# Patient Record
Sex: Female | Born: 2000 | Race: Black or African American | Hispanic: No | Marital: Single | Smoking: Never smoker
Health system: Southern US, Community
[De-identification: ages and names within clinical notes are randomized; demographics above are authoritative.]

## PROBLEM LIST (undated history)

## (undated) ENCOUNTER — Ambulatory Visit

## (undated) DIAGNOSIS — S060X9A Concussion with loss of consciousness of unspecified duration, initial encounter: Secondary | ICD-10-CM

## (undated) DIAGNOSIS — R519 Headache, unspecified: Secondary | ICD-10-CM

## (undated) DIAGNOSIS — R413 Other amnesia: Secondary | ICD-10-CM

## (undated) DIAGNOSIS — K759 Inflammatory liver disease, unspecified: Secondary | ICD-10-CM

## (undated) DIAGNOSIS — S060XAA Concussion with loss of consciousness status unknown, initial encounter: Secondary | ICD-10-CM

## (undated) DIAGNOSIS — L309 Dermatitis, unspecified: Secondary | ICD-10-CM

## (undated) DIAGNOSIS — R51 Headache: Secondary | ICD-10-CM

## (undated) DIAGNOSIS — R160 Hepatomegaly, not elsewhere classified: Secondary | ICD-10-CM

## (undated) HISTORY — DX: Hepatomegaly, not elsewhere classified: R16.0

## (undated) HISTORY — DX: Headache: R51

## (undated) HISTORY — DX: Dermatitis, unspecified: L30.9

## (undated) HISTORY — PX: NO PAST SURGERIES: SHX2092

## (undated) HISTORY — DX: Inflammatory liver disease, unspecified: K75.9

## (undated) HISTORY — DX: Other amnesia: R41.3

## (undated) HISTORY — DX: Headache, unspecified: R51.9

---
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
## 2018-09-02 ENCOUNTER — Other Ambulatory Visit: Payer: Self-pay

## 2018-09-02 DIAGNOSIS — Z20822 Contact with and (suspected) exposure to covid-19: Secondary | ICD-10-CM

## 2018-09-05 LAB — NOVEL CORONAVIRUS, NAA: SARS-CoV-2, NAA: NOT DETECTED

## 2018-09-28 ENCOUNTER — Ambulatory Visit (INDEPENDENT_AMBULATORY_CARE_PROVIDER_SITE_OTHER): Payer: BC Managed Care – PPO | Admitting: Women's Health

## 2018-09-28 ENCOUNTER — Other Ambulatory Visit: Payer: Self-pay

## 2018-09-28 ENCOUNTER — Encounter: Payer: Self-pay | Admitting: Women's Health

## 2018-09-28 VITALS — BP 121/76 | HR 107 | Ht 69.0 in | Wt 202.5 lb

## 2018-09-28 DIAGNOSIS — Z3046 Encounter for surveillance of implantable subdermal contraceptive: Secondary | ICD-10-CM

## 2018-09-28 DIAGNOSIS — Z30011 Encounter for initial prescription of contraceptive pills: Secondary | ICD-10-CM

## 2018-09-28 MED ORDER — LO LOESTRIN FE 1 MG-10 MCG / 10 MCG PO TABS: 1.0000 | ORAL_TABLET | Freq: Every day | ORAL | 3 refills | Status: DC

## 2018-09-28 NOTE — Progress Notes (Signed)
   [place] REMOVAL Patient name: Alisha Norman MRN [id number]  Date of birth: 09-27-2000 Subjective Findings:   Alisha Norman is a 18 y.o. G0P0000 African American female being seen today for removal of a Nexplanon. Her Nexplanon was placed 10/25/14.  She desires removal because it's past time, weight gain, and headaches. Signed copy of informed consent in chart.   No LMP recorded. Patient has had an implant. Last pap<21yo. Results were:  n/a The planned method of family planning is OCP (estrogen/progesterone). Does not smoke, no h/o HTN, DVT/PE, CVA, MI, or migraines w/ aura.  Pertinent History Reviewed:   Reviewed past medical,surgical, social, obstetrical and family history.  Reviewed problem list, medications and allergies. Objective Findings & Procedure:    Vitals:   09/28/18 1549  BP: 121/76  Pulse: (!) 107  Weight: 202 lb 8 oz (91.9 kg)  Height: 5\' 9"  (1.753 m)  Body mass index is 29.9 kg/m.  No results found for this or any previous visit (from the past 24 hour(s)).   Time out was performed.  Nexplanon site identified.  Area prepped in usual sterile fashon. One cc of 2% lidocaine was used to anesthetize the area at the distal end of the implant. A small stab incision was made right beside the implant on the distal portion.  The Nexplanon rod was grasped using hemostats and removed without difficulty.  There was less than 3 cc blood loss. There were no complications.  Steri-strips were applied over the small incision and a pressure bandage was applied.  The patient tolerated the procedure well. Assessment & Plan:   1) Nexplanon removal She was instructed to keep the area clean and dry, remove pressure bandage in 24 hours, and keep insertion site covered with the steri-strip for 3-5 days.   Follow-up PRN problems.  2) Contraception management> rx LoLoestrin, condoms x 2wks, f/u 62mths  No orders of the defined types were placed in this encounter.   Follow-up: Return in  about 3 months (around 12/29/2018) for F/U, MyChart Video then after 1/3 for , Pap & physical.  Roma Schanz CNM, [facility] 09/28/2018 4:17 PM

## 2018-09-28 NOTE — Patient Instructions (Addendum)
Keep the area clean and dry.  You can remove the big bandage in 24 hours, and the small steri-strip bandage in 3-5 days.  A back up method, such as condoms, should be used for two weeks.   Text Lo Loestrin Fe to [phone]  Oral Contraception Information Oral contraceptive pills (OCPs) are medicines taken to prevent pregnancy. OCPs are taken by mouth, and they work by:  Preventing the ovaries from releasing eggs.  Thickening mucus in the lower part of the uterus (cervix), which prevents sperm from entering the uterus.  Thinning the lining of the uterus (endometrium), which prevents a fertilized egg from attaching to the endometrium. OCPs are highly effective when taken exactly as prescribed. However, OCPs do not prevent STIs (sexually transmitted infections). Safe sex practices, such as using condoms while on an OCP, can help prevent STIs. Before starting OCPs Before you start taking OCPs, you may have a physical exam, blood test, and Pap test. However, you are not required to have a pelvic exam in order to be prescribed OCPs. Your health care provider will make sure you are a good candidate for oral contraception. OCPs are not a good option for certain women, including women who smoke and are older than 35 years, and women with a medical history of high blood pressure, deep vein thrombosis, pulmonary embolism, stroke, cardiovascular disease, or peripheral vascular disease. Discuss with your health care provider the possible side effects of the OCP you may be prescribed. When you start an OCP, be aware that it can take 2-3 months for your body to adjust to changes in hormone levels. Follow instructions from your health care provider about how to start taking your first cycle of OCPs. Depending on when you start the pill, you may need to use a backup form of birth control, such as condoms, during the first week. Make sure you know what steps to take if you ever forget to take the pill. Types of oral  contraception  The most common types of birth control pills contain the hormones estrogen and progestin (synthetic progesterone) or progestin only. The combination pill This type of pill contains estrogen and progestin hormones. Combination pills often come in packs of 21, 28, or 91 pills. For each pack, the last 7 pills may not contain hormones, which means you may stop taking the pills for 7 days. Menstrual bleeding occurs during the week that you do not take the pills or that you take the pills with no hormones in them. The minipill This type of pill contains the progestin hormone only. It comes in packs of 28 pills. All 28 pills contain the hormone. You take the pill every day. It is very important to take the pill at the same time each day. Advantages of oral contraceptive pills  Provides reliable and continuous contraception if taken as instructed.  May treat or decrease symptoms of: ? Menstrual period cramps. ? Irregular menstrual cycle or bleeding. ? Heavy menstrual flow. ? Abnormal uterine bleeding. ? Acne, depending on the type of pill. ? Polycystic ovarian syndrome. ? Endometriosis. ? Iron deficiency anemia. ? Premenstrual symptoms, including premenstrual dysphoric disorder.  May reduce the risk of endometrial and ovarian cancer.  Can be used as emergency contraception.  Prevents mislocated (ectopic) pregnancies and infections of the fallopian tubes. Things that can make oral contraceptive pills less effective OCPs can be less effective if:  You forget to take the pill at the same time every day. This is especially important when taking  the minipill.  You have a stomach or intestinal disease that reduces your body's ability to absorb the pill.  You take OCPs with other medicines that make OCPs less effective, such as antibiotics, certain HIV medicines, and some seizure medicines.  You take expired OCPs.  You forget to restart the pill on day 7, if using the packs of  21 pills. Risks associated with oral contraceptive pills Oral contraceptive pills can sometimes cause side effects, such as:  Headache.  Depression.  Trouble sleeping.  Nausea and vomiting.  Breast tenderness.  Irregular bleeding or spotting during the first several months.  Bloating or fluid retention.  Increase in blood pressure. Combination pills are also associated with a small increase in the risk of:  Blood clots.  Heart attack.  Stroke. Summary  Oral contraceptive pills are medicines taken by mouth to prevent pregnancy. They are highly effective when taken exactly as prescribed.  The most common types of birth control pills contain the hormones estrogen and progestin (synthetic progesterone) or progestin only.  Before you start taking the pill, you may have a physical exam, blood test, and Pap test. Your health care provider will make sure you are a good candidate for oral contraception.  The combination pill may come in a 21-day pack, a 28-day pack, or a 91-day pack. The minipill contains the progesterone hormone only and comes in packs of 28 pills.  Oral contraceptive pills can sometimes cause side effects, such as headache, nausea, breast tenderness, or irregular bleeding. This information is not intended to replace advice given to you by your health care provider. Make sure you discuss any questions you have with your health care provider. Document Released: 04/19/2002 Document Revised: 01/09/2017 Document Reviewed: 04/22/2016 Elsevier Patient Education  2020 [employer].

## 2018-10-07 ENCOUNTER — Other Ambulatory Visit: Payer: Self-pay

## 2018-10-07 ENCOUNTER — Encounter (HOSPITAL_COMMUNITY): Payer: Self-pay | Admitting: Emergency Medicine

## 2018-10-07 ENCOUNTER — Emergency Department (HOSPITAL_COMMUNITY)
Admission: EM | Admit: 2018-10-07 | Discharge: 2018-10-07 | Disposition: A | Discharge status: Home / Self Care | Payer: BC Managed Care – PPO | Attending: Emergency Medicine | Admitting: Emergency Medicine

## 2018-10-07 DIAGNOSIS — S56911A Strain of unspecified muscles, fascia and tendons at forearm level, right arm, initial encounter: Secondary | ICD-10-CM | POA: Diagnosis not present

## 2018-10-07 DIAGNOSIS — T148XXA Other injury of unspecified body region, initial encounter: Secondary | ICD-10-CM

## 2018-10-07 DIAGNOSIS — X500XXA Overexertion from strenuous movement or load, initial encounter: Secondary | ICD-10-CM | POA: Diagnosis not present

## 2018-10-07 DIAGNOSIS — Y9389 Activity, other specified: Secondary | ICD-10-CM | POA: Diagnosis not present

## 2018-10-07 DIAGNOSIS — Z79899 Other long term (current) drug therapy: Secondary | ICD-10-CM | POA: Diagnosis not present

## 2018-10-07 DIAGNOSIS — S56912A Strain of unspecified muscles, fascia and tendons at forearm level, left arm, initial encounter: Secondary | ICD-10-CM | POA: Insufficient documentation

## 2018-10-07 DIAGNOSIS — Y999 Unspecified external cause status: Secondary | ICD-10-CM | POA: Insufficient documentation

## 2018-10-07 DIAGNOSIS — S59911A Unspecified injury of right forearm, initial encounter: Secondary | ICD-10-CM | POA: Diagnosis present

## 2018-10-07 DIAGNOSIS — Y9289 Other specified places as the place of occurrence of the external cause: Secondary | ICD-10-CM | POA: Diagnosis not present

## 2018-10-07 MED ORDER — NAPROXEN 500 MG PO TABS: 500.0000 mg | ORAL_TABLET | Freq: Two times a day (BID) | ORAL | 0 refills | Status: DC

## 2018-10-07 NOTE — ED Notes (Signed)
Signature pad erased signature  Pt stated she understood discharge information

## 2018-10-07 NOTE — Discharge Instructions (Signed)
Your exam is consistent with having a muscle strain, this is an overuse injury.  Please do light duty for the next week, take naproxen twice daily, plenty of fluids.

## 2018-10-07 NOTE — ED Provider Notes (Signed)
[facility] EMERGENCY DEPARTMENT Provider Note   CSN: [id number] Arrival date & time: 10/07/18  2118     History   Chief Complaint Chief Complaint  Patient presents with  . Arm Pain    bilateral    HPI Alisha Norman is a 18 y.o. female.     HPI  The patient has bilateral forearm pain that occurred approximately 1 week after starting a job where she is doing heavy lifting as well as lifting weights.  This is located in the proximal forearms on the dorsal surface just distal to the elbow.  Worse with range of motion and lifting movements.  Not associated with any numbness or weakness.  Past Medical History:  Diagnosis Date  . Amnesia   . Concussion   . Dermatitis   . Enlarged liver   . Headache   . Hepatitis     There are no active problems to display for this patient.   History reviewed. No pertinent surgical history.   OB History    Gravida  0   Para  0   Term  0   Preterm  0   AB  0   Living  0     SAB  0   TAB  0   Ectopic  0   Multiple  0   Live Births  0            Home Medications    Prior to Admission medications   Medication Sig Start Date End Date Taking? Authorizing Provider  etonogestrel (NEXPLANON) 68 MG IMPL implant 1 each by Subdermal route once.    [provider]  Multiple Vitamin (MULTIVITAMIN WITH MINERALS) TABS tablet Take 1 tablet by mouth daily.    [provider]  naproxen (NAPROSYN) 500 MG tablet Take 1 tablet (500 mg total) by mouth 2 (two) times daily with a meal. 10/07/18   Eber HongMiller, Keefe Zawistowski, MD  Norethindrone-Ethinyl Estradiol-Fe Biphas (LO LOESTRIN FE) 1 MG-10 MCG / 10 MCG tablet Take 1 tablet by mouth daily. 09/28/18   Cheral MarkerBooker, Kimberly R, CNM  traMADol Janean Sark(ULTRAM) 50 MG tablet 1 or 2 po bid with food Patient not taking: Reported on 11/27/2017 06/29/17   Ivery QualeBryant, Hobson, PA-C    Family History Family History  Problem Relation Age of Onset  . Heart attack Paternal Grandfather   . Diabetes Maternal  Grandmother     Social History Social History   Tobacco Use  . Smoking status: Never Smoker  . Smokeless tobacco: Never Used  Substance Use Topics  . Alcohol use: No  . Drug use: No     Allergies   Codeine and Penicillins   Review of Systems Review of Systems  Musculoskeletal: Positive for myalgias. Negative for joint swelling.  Skin: Negative for color change, pallor, rash and wound.  Neurological: Negative for weakness and numbness.     Physical Exam Updated Vital Signs BP 118/80 (BP Location: Right Arm)   Pulse 91   Temp 98.2 F (36.8 C) (Oral)   Resp 16   Ht 1.753 m (5\' 9" )   Wt 91.6 kg   SpO2 99%   BMI 29.83 kg/m   Physical Exam Vitals signs and nursing note reviewed.  Constitutional:      Appearance: She is well-developed. She is not diaphoretic.  HENT:     Head: Normocephalic and atraumatic.  Eyes:     General:        Right eye: No discharge.  Left eye: No discharge.     Conjunctiva/sclera: Conjunctivae normal.  Pulmonary:     Effort: Pulmonary effort is normal. No respiratory distress.  Musculoskeletal:        General: Tenderness present.     Comments: Mild tenderness in the bilateral forearms just distal to the elbow over the dorsal radial surface.  Worse with extension of the wrists  Skin:    General: Skin is warm and dry.     Findings: No erythema or rash.  Neurological:     Mental Status: She is alert.     Coordination: Coordination normal.      ED Treatments / Results  Labs (all labs ordered are listed, but only abnormal results are displayed) Labs Reviewed - No data to display  EKG None  Radiology No results found.  Procedures Procedures (including critical care time)  Medications Ordered in ED Medications - No data to display   Initial Impression / Assessment and Plan / ED Course  I have reviewed the triage vital signs and the nursing notes.  Pertinent labs & imaging results that were available during my care  of the patient were reviewed by me and considered in my medical decision making (see chart for details).       There is and discomfort is bilateral, symmetrical, associated with movement of those muscles, not associated with any other pathologic findings.  Patient stable for discharge on an anti-inflammatory, light duty for 1 week  Final Clinical Impressions(s) / ED Diagnoses   Final diagnoses:  Muscle strain    ED Discharge Orders         Ordered    naproxen (NAPROSYN) 500 MG tablet  2 times daily with meals     10/07/18 2234           Noemi Chapel, MD 10/07/18 2236

## 2018-10-07 NOTE — ED Triage Notes (Signed)
Pt reports bilateral posterior forearm pain after working out and lifting boxes. ROM within normal limits

## 2018-10-18 IMAGING — DX DG CHEST 2V
2 series · 2 of 2 positions shown · non-contrast
Comparison: 02/28/2015

CLINICAL DATA: Chest pain starting last night

EXAM:
CHEST  2 VIEW

[chest pa]
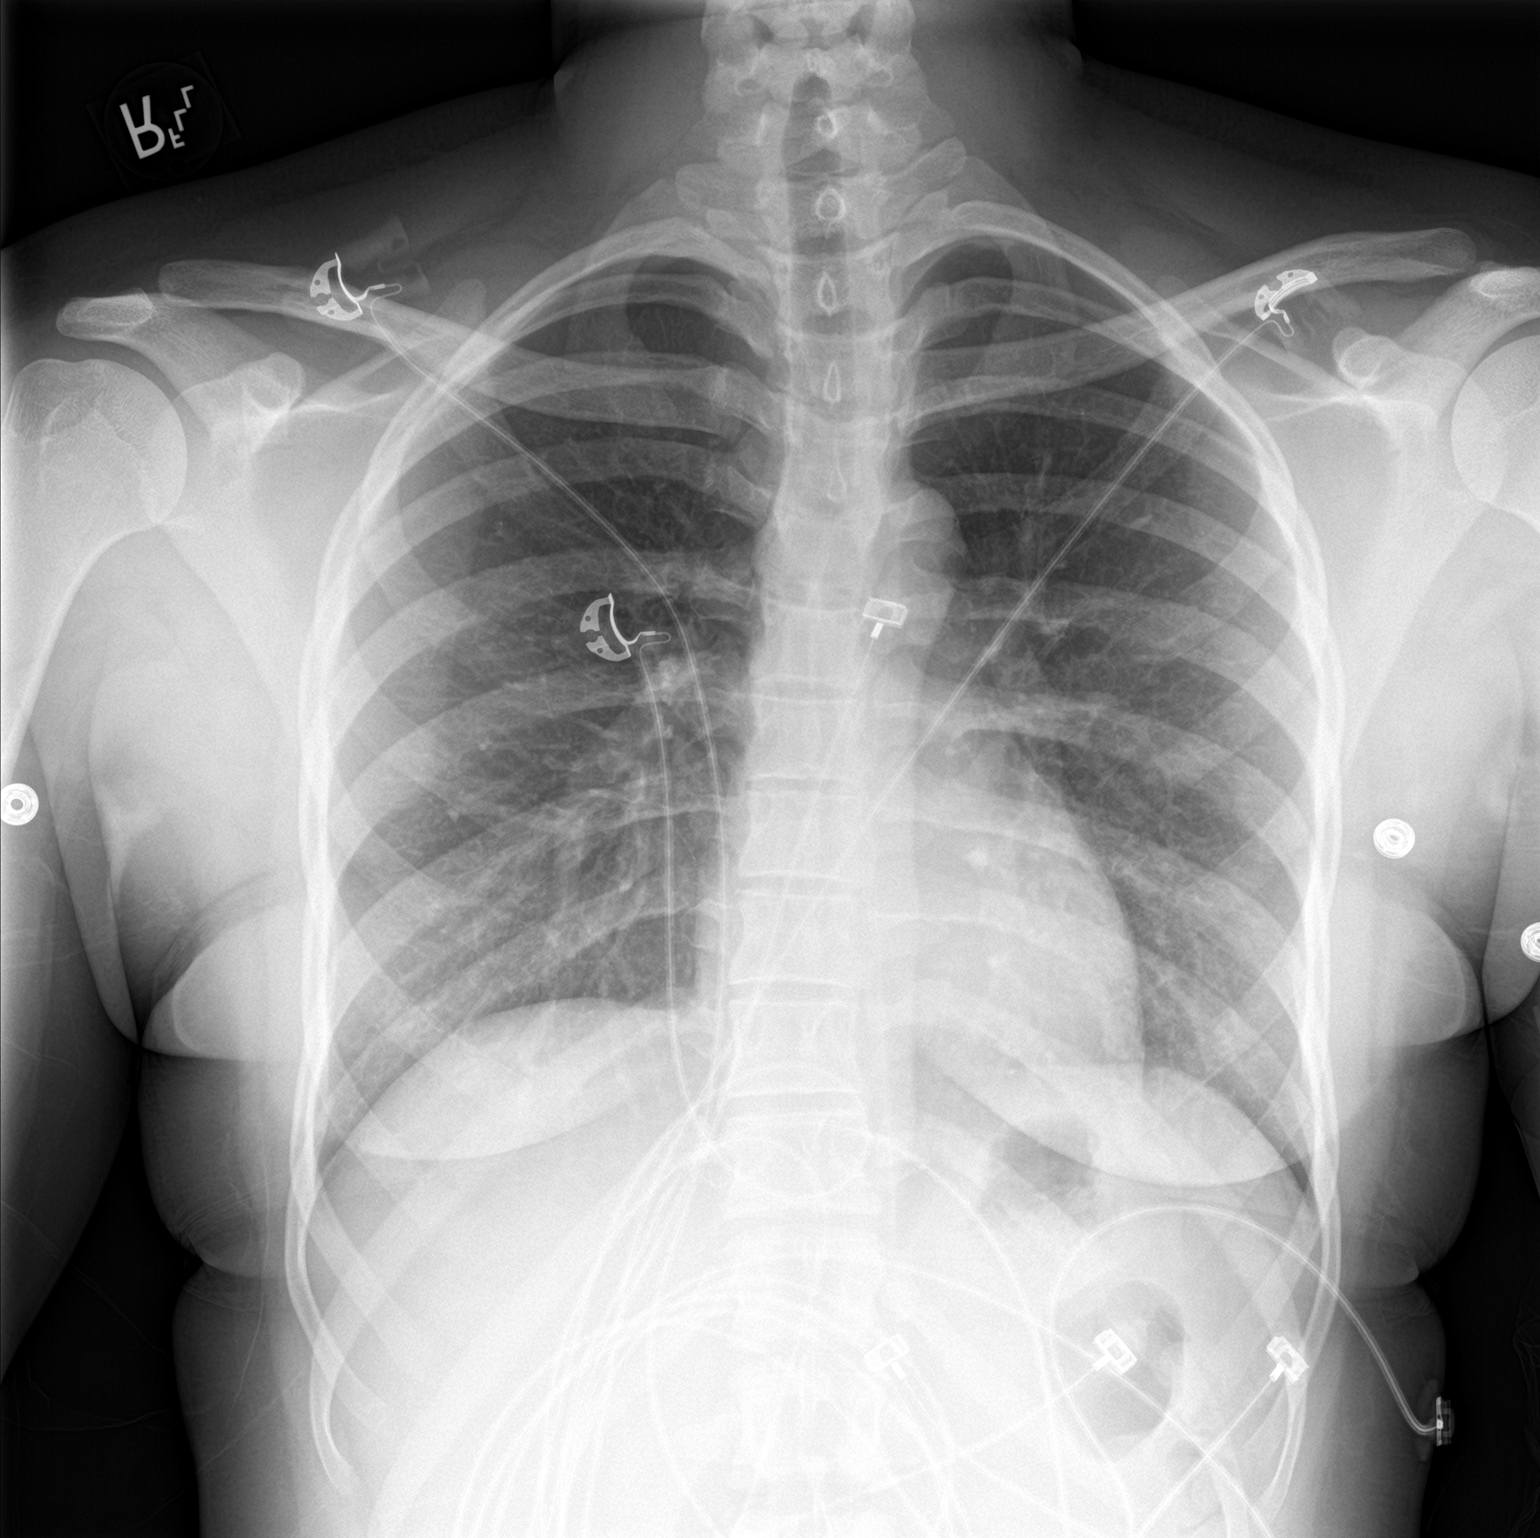

[chest lat]
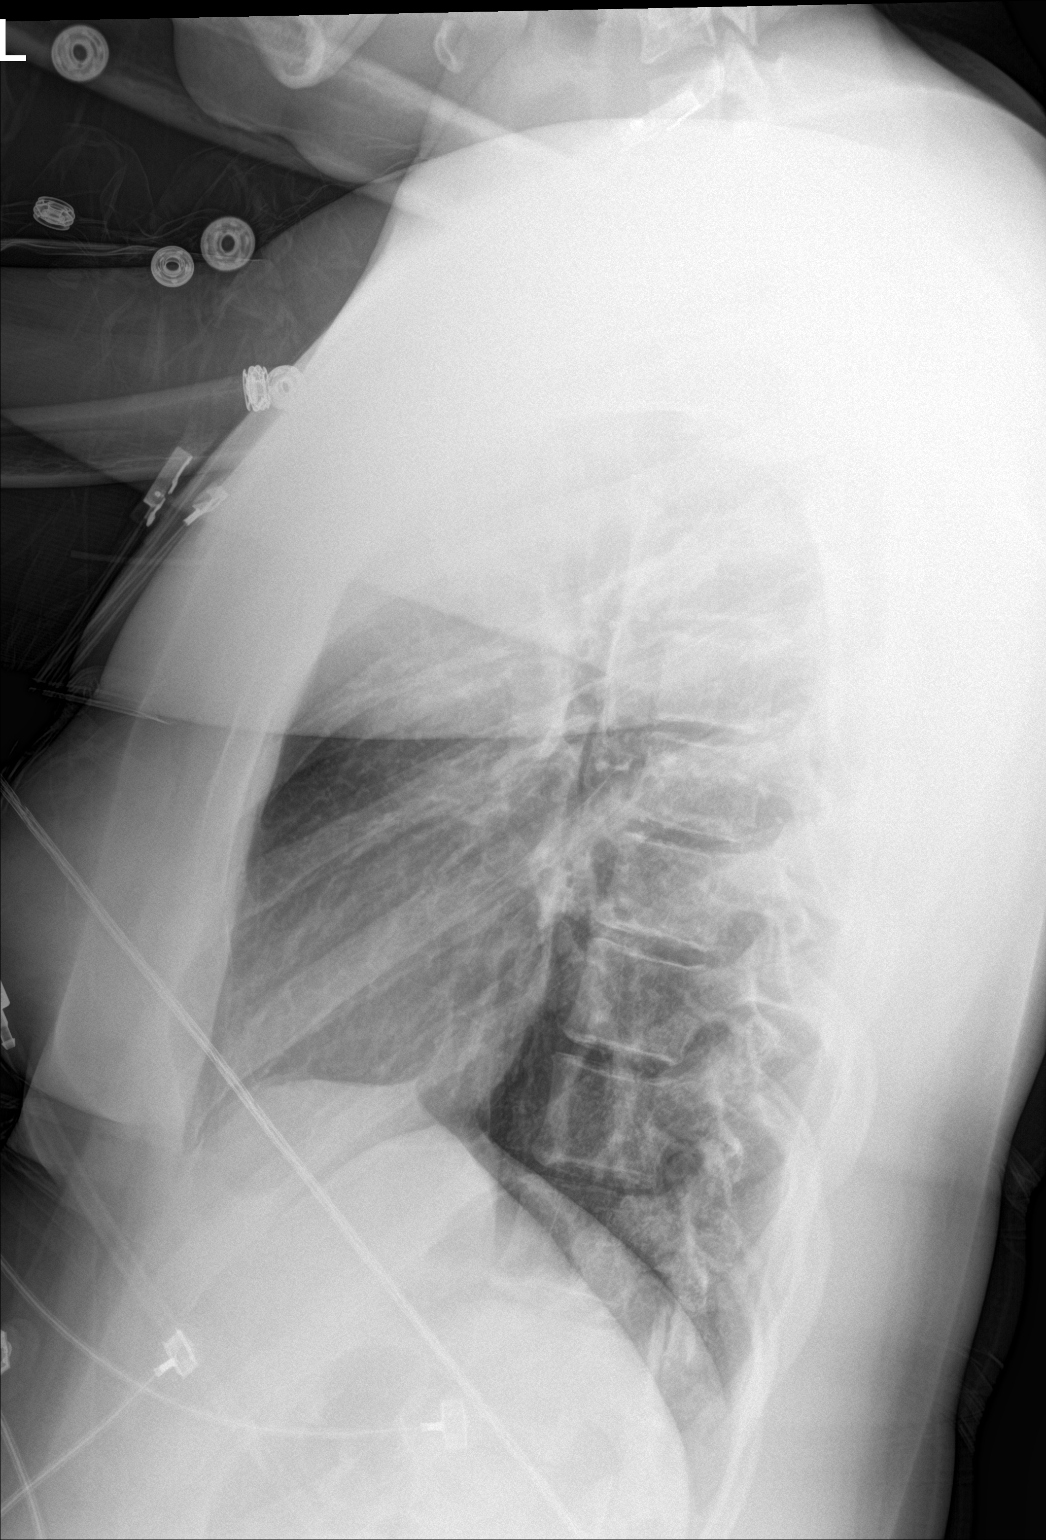

[2 of 2 positions shown; findings below may reference images not displayed]

FINDINGS: Cardiomediastinal silhouette is stable. No infiltrate or pleural
effusion. No pulmonary edema. Bony thorax is stable.
IMPRESSION: No active cardiopulmonary disease.

## 2018-10-26 ENCOUNTER — Encounter: Payer: Self-pay | Admitting: *Deleted

## 2018-12-08 ENCOUNTER — Other Ambulatory Visit: Payer: Self-pay | Admitting: *Deleted

## 2018-12-08 DIAGNOSIS — Z20822 Contact with and (suspected) exposure to covid-19: Secondary | ICD-10-CM

## 2018-12-10 LAB — NOVEL CORONAVIRUS, NAA: SARS-CoV-2, NAA: NOT DETECTED

## 2018-12-28 ENCOUNTER — Encounter: Payer: Self-pay | Admitting: Women's Health

## 2018-12-28 ENCOUNTER — Other Ambulatory Visit: Payer: Self-pay

## 2018-12-28 ENCOUNTER — Telehealth (INDEPENDENT_AMBULATORY_CARE_PROVIDER_SITE_OTHER): Payer: BLUE CROSS/BLUE SHIELD | Admitting: Women's Health

## 2018-12-28 DIAGNOSIS — Z3041 Encounter for surveillance of contraceptive pills: Secondary | ICD-10-CM | POA: Diagnosis not present

## 2018-12-28 NOTE — Progress Notes (Signed)
   TELEHEALTH VIRTUAL GYN VISIT ENCOUNTER NOTE Patient name: Alisha Norman MRN [id number]  Date of birth: [birth year]  I connected with patient on 12/28/18 at 11:50 AM EST by telephone (unable to get her mychart to work) and verified that I am speaking with the correct person using two identifiers.  Due to COVID-19 recommendations, pt is not currently in the office.    I discussed the limitations, risks, security and privacy concerns of performing an evaluation and management service by telephone and the availability of in person appointments. I also discussed with the patient that there may be a patient responsible charge related to this service. The patient expressed understanding and agreed to proceed.   Chief Complaint:   Follow-up (Birth Control Pills)  History of Present Illness:   Caleyah Jr Casad is a 18 y.o. G0P0000 African American female being evaluated today for f/u on LoLoestrin rx'd 09/28/18. Doing well, had period 1st pack, but not 2nd.      Patient's last menstrual period was 11/25/2018 (exact date). The current method of family planning is OCP (estrogen/progesterone).  Last pap <21yo. Results were:  n/a Review of Systems:   Pertinent items are noted in HPI Denies fever/chills, dizziness, headaches, visual disturbances, fatigue, shortness of breath, chest pain, abdominal pain, vomiting, abnormal vaginal discharge/itching/odor/irritation, problems with periods, bowel movements, urination, or intercourse unless otherwise stated above.  Pertinent History Reviewed:  Reviewed past medical,surgical, social, obstetrical and family history.  Reviewed problem list, medications and allergies. Physical Assessment:  There were no vitals filed for this visit.There is no height or weight on file to calculate BMI.       Physical Examination:   General:  Alert, oriented and cooperative.   Mental Status: Normal mood and affect perceived. Normal judgment and thought content.  Physical exam  deferred due to nature of the encounter  No results found for this or any previous visit (from the past 24 hour(s)).  Assessment & Plan:  1) Contraception surveillance> doing well on LoLoestrin, continue as rx'd  Meds: No orders of the defined types were placed in this encounter.   No orders of the defined types were placed in this encounter.   I discussed the assessment and treatment plan with the patient. The patient was provided an opportunity to ask questions and all were answered. The patient agreed with the plan and demonstrated an understanding of the instructions.   The patient was advised to call back or seek an in-person evaluation/go to the ED if the symptoms worsen or if the condition fails to improve as anticipated.  I provided 10 minutes of non-face-to-face time during this encounter.   Return for after 1/3 for , Pap & physical.  Roma Schanz CNM, [facility] 12/28/2018 12:29 PM

## 2019-02-16 ENCOUNTER — Telehealth: Payer: Self-pay | Admitting: *Deleted

## 2019-02-16 ENCOUNTER — Other Ambulatory Visit: Payer: Self-pay | Admitting: Adult Health

## 2019-02-16 MED ORDER — NORETHIN ACE-ETH ESTRAD-FE 1-20 MG-MCG PO TABS: 1.0000 | ORAL_TABLET | Freq: Every day | ORAL | 11 refills | Status: DC

## 2019-02-16 NOTE — Telephone Encounter (Signed)
Pt's new insurance is not covering her current birth control. She needs something insurance will cover. Thanks!!

## 2019-02-16 NOTE — Telephone Encounter (Addendum)
Pt has [employer]. Pt will send a message through MyChart letting us know what is covered. JSY

## 2019-02-16 NOTE — Progress Notes (Signed)
will rx junel 1/20,since lo Loestrin not covered by insurance

## 2019-05-02 ENCOUNTER — Ambulatory Visit: Payer: BLUE CROSS/BLUE SHIELD | Attending: Internal Medicine

## 2019-05-03 ENCOUNTER — Other Ambulatory Visit: Payer: Self-pay

## 2019-05-03 ENCOUNTER — Ambulatory Visit: Payer: Managed Care, Other (non HMO) | Attending: Internal Medicine

## 2019-05-03 DIAGNOSIS — Z20822 Contact with and (suspected) exposure to covid-19: Secondary | ICD-10-CM

## 2019-05-05 LAB — NOVEL CORONAVIRUS, NAA: SARS-CoV-2, NAA: NOT DETECTED

## 2019-05-05 LAB — SARS-COV-2, NAA 2 DAY TAT

## 2019-09-20 ENCOUNTER — Other Ambulatory Visit (INDEPENDENT_AMBULATORY_CARE_PROVIDER_SITE_OTHER): Payer: Managed Care, Other (non HMO) | Admitting: *Deleted

## 2019-09-20 ENCOUNTER — Other Ambulatory Visit (HOSPITAL_COMMUNITY)
Admission: RE | Admit: 2019-09-20 | Discharge: 2019-09-20 | Disposition: A | Discharge status: Home / Self Care | Payer: Managed Care, Other (non HMO) | Source: Ambulatory Visit | Attending: Obstetrics & Gynecology | Admitting: Obstetrics & Gynecology

## 2019-09-20 DIAGNOSIS — Z113 Encounter for screening for infections with a predominantly sexual mode of transmission: Secondary | ICD-10-CM | POA: Insufficient documentation

## 2019-09-20 NOTE — Progress Notes (Signed)
   NURSE VISIT-STD  SUBJECTIVE:  Alisha Norman is a 19 y.o. G0P0000 GYN patientfemale here for a vaginal swab for STD screen.  She reports the following symptoms: none for 0 days. Denies abnormal vaginal bleeding, significant pelvic pain, fever, or UTI symptoms.  OBJECTIVE:  There were no vitals taken for this visit.  Appears well, in no apparent distress  ASSESSMENT: Vaginal swab for STD screen  PLAN: Self-collected vaginal probe for Gonorrhea, Chlamydia, Trichomonas sent to lab.Order also given for HIV and RPR.  Treatment: to be determined once results are received Follow-up as needed if symptoms persist/worsen, or new symptoms develop  [name]  09/20/2019 11:06 AM

## 2019-09-20 NOTE — Progress Notes (Signed)
Chart reviewed for nurse visit. Agree with plan of care.  Adline Potter, NP 09/20/2019 12:32 PM

## 2019-09-21 LAB — HIV ANTIBODY (ROUTINE TESTING W REFLEX): HIV Screen 4th Generation wRfx: NONREACTIVE

## 2019-09-21 LAB — CERVICOVAGINAL ANCILLARY ONLY
Chlamydia: NEGATIVE
Comment: NEGATIVE
Comment: NEGATIVE
Comment: NORMAL
Neisseria Gonorrhea: NEGATIVE
Trichomonas: NEGATIVE

## 2019-09-21 LAB — RPR: RPR Ser Ql: NONREACTIVE

## 2019-10-25 ENCOUNTER — Other Ambulatory Visit: Payer: Self-pay

## 2019-10-25 ENCOUNTER — Other Ambulatory Visit (HOSPITAL_COMMUNITY)
Admission: RE | Admit: 2019-10-25 | Discharge: 2019-10-25 | Disposition: A | Discharge status: Home / Self Care | Payer: Managed Care, Other (non HMO) | Source: Ambulatory Visit | Attending: Obstetrics & Gynecology | Admitting: Obstetrics & Gynecology

## 2019-10-25 ENCOUNTER — Encounter: Payer: Self-pay | Admitting: Women's Health

## 2019-10-25 ENCOUNTER — Ambulatory Visit (INDEPENDENT_AMBULATORY_CARE_PROVIDER_SITE_OTHER): Payer: Managed Care, Other (non HMO) | Admitting: Women's Health

## 2019-10-25 VITALS — BP 126/85 | HR 96 | Ht 69.0 in | Wt 200.6 lb

## 2019-10-25 DIAGNOSIS — Z01419 Encounter for gynecological examination (general) (routine) without abnormal findings: Secondary | ICD-10-CM | POA: Insufficient documentation

## 2019-10-25 DIAGNOSIS — Z113 Encounter for screening for infections with a predominantly sexual mode of transmission: Secondary | ICD-10-CM | POA: Diagnosis not present

## 2019-10-25 NOTE — Progress Notes (Signed)
   WELL-WOMAN EXAMINATION Patient name: Alisha Norman MRN [id number]  Date of birth: 2000-11-20 Chief Complaint:   Gynecologic Exam (STD screening)  History of Present Illness:   Alisha Norman is a 19 y.o. G0P0000 African American female being seen today for a routine well-woman exam.  Current complaints: wants STD screen, condom came off inside of her  Depression screen [facility] 2/9 10/25/2019 03/23/2017  Decreased Interest 0 0  Down, Depressed, Hopeless 0 0  PHQ - 2 Score 0 0  Altered sleeping 0 -  Tired, decreased energy 0 -  Change in appetite 0 -  Feeling bad or failure about yourself  0 -  Trouble concentrating 0 -  Moving slowly or fidgety/restless 0 -  Suicidal thoughts 0 -  PHQ-9 Score 0 -     PCP: none      does not desire labs, other than STD screen Patient's last menstrual period was 09/26/2019. The current method of family planning is OCP (estrogen/progesterone).  Last pap never, 19yo. Results were: N/A. H/O abnormal pap: no Last mammogram: never. Results were: N/A. Family h/o breast cancer: no Last colonoscopy: never. Results were: N/A. Family h/o colorectal cancer: no Review of Systems:   Pertinent items are noted in HPI Denies any headaches, blurred vision, fatigue, shortness of breath, chest pain, abdominal pain, abnormal vaginal discharge/itching/odor/irritation, problems with periods, bowel movements, urination, or intercourse unless otherwise stated above. Pertinent History Reviewed:  Reviewed past medical,surgical, social and family history.  Reviewed problem list, medications and allergies. Physical Assessment:   Vitals:   10/25/19 0922 10/25/19 0933  BP: (!) 148/90 126/85  Pulse: (!) 104 96  Weight: 200 lb 9.6 oz (91 kg)   Height: 5\' 9"  (1.753 m)   Body mass index is 29.62 kg/m.        Physical Examination:   General appearance - well appearing, and in no distress  Mental status - alert, oriented to person, place, and time  Psych:  She has a normal  mood and affect  Skin - warm and dry, normal color, no suspicious lesions noted  Chest - effort normal, all lung fields clear to auscultation bilaterally  Heart - normal rate and regular rhythm  Neck:  midline trachea, no thyromegaly or nodules  Breasts - breasts appear normal, no suspicious masses, no skin or nipple changes or  axillary nodes  Abdomen - soft, nontender, nondistended, no masses or organomegaly  Pelvic - VULVA: normal appearing vulva with no masses, tenderness or lesions  VAGINA: normal appearing vagina with normal color and discharge, no lesions  CERVIX: normal appearing cervix without discharge or lesions, no CMT  Thin prep pap is done w/ HR HPV cotesting  UTERUS: uterus is felt to be normal size, shape, consistency and nontender   ADNEXA: No adnexal masses or tenderness noted.  Extremities:  No swelling or varicosities noted  Chaperone:    No results found for this or any previous visit (from the past 24 hour(s)).  Assessment & Plan:  1) Well-Woman Exam  2) STD screen  Labs/procedures today: pap, STD screen  Mammogram @21yo  or sooner if problems Colonoscopy @21yo  or sooner if problems  Orders Placed This Encounter  Procedures  . HIV Antibody (routine testing w rflx)  . RPR    Meds: No orders of the defined types were placed in this encounter.   Follow-up: Return in about 1 year (around 10/24/2020) for Physical.  CNM, [facility] 10/25/2019 9:56 AM

## 2019-10-26 LAB — CYTOLOGY - PAP
Chlamydia: NEGATIVE
Comment: NEGATIVE
Comment: NORMAL
Diagnosis: NEGATIVE
Neisseria Gonorrhea: NEGATIVE

## 2019-10-26 LAB — HIV ANTIBODY (ROUTINE TESTING W REFLEX): HIV Screen 4th Generation wRfx: NONREACTIVE

## 2019-10-26 LAB — RPR: RPR Ser Ql: NONREACTIVE

## 2020-01-03 ENCOUNTER — Ambulatory Visit: Payer: Managed Care, Other (non HMO) | Admitting: Women's Health

## 2020-01-04 ENCOUNTER — Encounter: Payer: Self-pay | Admitting: Women's Health

## 2020-01-04 ENCOUNTER — Ambulatory Visit (INDEPENDENT_AMBULATORY_CARE_PROVIDER_SITE_OTHER): Payer: Managed Care, Other (non HMO) | Admitting: Women's Health

## 2020-01-04 ENCOUNTER — Other Ambulatory Visit: Payer: Self-pay

## 2020-01-04 VITALS — BP 139/74 | HR 84 | Ht 68.0 in | Wt 201.0 lb

## 2020-01-04 DIAGNOSIS — Z113 Encounter for screening for infections with a predominantly sexual mode of transmission: Secondary | ICD-10-CM | POA: Diagnosis not present

## 2020-01-04 DIAGNOSIS — Z3202 Encounter for pregnancy test, result negative: Secondary | ICD-10-CM

## 2020-01-04 LAB — POCT URINE PREGNANCY: Preg Test, Ur: NEGATIVE

## 2020-01-04 NOTE — Addendum Note (Signed)
Addended by: [name] S on: 01/04/2020 04:00 PM   Modules accepted: Orders

## 2020-01-04 NOTE — Progress Notes (Signed)
   GYN VISIT Patient name: Alisha Norman MRN [id number]  Date of birth: 08-Mar-2000 Chief Complaint:   Follow-up (labs/std screening)  History of Present Illness:   Alisha Norman is a 19 y.o. G0P0000 African American female being seen today for STD screen. Condom came off inside of her right before last visit on 10/25/19, initial STD screen was neg, wants to repeat now to make sure. Denies abnormal discharge, itching/odor/irritation.   Depression screen [facility] 2/9 10/25/2019 03/23/2017  Decreased Interest 0 0  Down, Depressed, Hopeless 0 0  PHQ - 2 Score 0 0  Altered sleeping 0 -  Tired, decreased energy 0 -  Change in appetite 0 -  Feeling bad or failure about yourself  0 -  Trouble concentrating 0 -  Moving slowly or fidgety/restless 0 -  Suicidal thoughts 0 -  PHQ-9 Score 0 -    Patient's last menstrual period was 10/25/2019. The current method of family planning is OCP (estrogen/progesterone).  Last pap 10/25/19. Results were:  normal Review of Systems:   Pertinent items are noted in HPI Denies fever/chills, dizziness, headaches, visual disturbances, fatigue, shortness of breath, chest pain, abdominal pain, vomiting, abnormal vaginal discharge/itching/odor/irritation, problems with periods, bowel movements, urination, or intercourse unless otherwise stated above.  Pertinent History Reviewed:  Reviewed past medical,surgical, social, obstetrical and family history.  Reviewed problem list, medications and allergies. Physical Assessment:   Vitals:   01/04/20 1417  BP: 139/74  Pulse: 84  Weight: 201 lb (91.2 kg)  Height: 5\' 8"  (1.727 m)  Body mass index is 30.56 kg/m.       Physical Examination:   General appearance: alert, well appearing, and in no distress  Mental status: alert, oriented to person, place, and time  Skin: warm & dry   Cardiovascular: normal heart rate noted  Respiratory: normal respiratory effort, no distress  Abdomen: soft, non-tender   Pelvic: VULVA: normal  appearing vulva with no masses, tenderness or lesions, VAGINA: normal appearing vagina with normal color and discharge, no lesions, CERVIX: normal appearing cervix without discharge or lesions  Extremities: no edema   Chaperone:    Results for orders placed or performed in visit on 01/04/20 (from the past 24 hour(s))  POCT urine pregnancy   Collection Time: 01/04/20  2:30 PM  Result Value Ref Range   Preg Test, Ur Negative Negative    Assessment & Plan:  1) STD screen> CV swab, hiv/rpr  Meds: No orders of the defined types were placed in this encounter.   Orders Placed This Encounter  Procedures  . HIV Antibody (routine testing w rflx)  . RPR  . POCT urine pregnancy    Return in about 1 year (around 01/03/2021) for Physical.  01/05/2021 CNM, [facility] 01/04/2020 2:44 PM

## 2020-01-05 LAB — RPR: RPR Ser Ql: NONREACTIVE

## 2020-01-05 LAB — HIV ANTIBODY (ROUTINE TESTING W REFLEX): HIV Screen 4th Generation wRfx: NONREACTIVE

## 2020-01-07 LAB — GC/CHLAMYDIA PROBE AMP
Chlamydia trachomatis, NAA: NEGATIVE
Neisseria Gonorrhoeae by PCR: NEGATIVE

## 2020-01-07 LAB — TRICHOMONAS VAGINALIS, PROBE AMP: Trich vag by NAA: NEGATIVE

## 2020-02-11 NOTE — L&D Delivery Note (Signed)
Delivery Note After a 30 minute 2nd stage, at 3:30 AM a viable female was delivered via Vaginal, Spontaneous (Presentation: Right Occiput Anterior).  APGAR: 5, 6; 7 weight pending. After 1 minute, the cord was clamped and cut. 40 units of pitocin diluted in 1000cc LR was infused rapidly IV.  The placenta separated spontaneously and delivered via CCT and maternal pushing effort.  It was inspected and appears to be intact with a 3 VC. D/T tachypnea, NICU was called to assess baby. See note. Cord blood for ABG was not drawn because baby started crying vigorously on the mother's abdomen within 1 minute.  Anesthesia: Epidural Episiotomy: None Lacerations: 2nd degree Suture Repair: 2.0 vicryl Est. Blood Loss (mL): 737  Mom to postpartum.  Baby to NICU.  Scarlette Calico Cresenzo-Dishmon 10/05/2020, 4:15 AM

## 2020-02-16 ENCOUNTER — Ambulatory Visit (INDEPENDENT_AMBULATORY_CARE_PROVIDER_SITE_OTHER): Payer: Managed Care, Other (non HMO) | Admitting: *Deleted

## 2020-02-16 ENCOUNTER — Other Ambulatory Visit: Payer: Self-pay

## 2020-02-16 ENCOUNTER — Encounter: Payer: Self-pay | Admitting: *Deleted

## 2020-02-16 VITALS — BP 140/87 | HR 108 | Ht 68.0 in | Wt 199.0 lb

## 2020-02-16 DIAGNOSIS — Z3201 Encounter for pregnancy test, result positive: Secondary | ICD-10-CM | POA: Diagnosis not present

## 2020-02-16 LAB — POCT URINE PREGNANCY: Preg Test, Ur: POSITIVE — AB

## 2020-02-16 NOTE — Progress Notes (Signed)
Chart reviewed for nurse visit. Agree with plan of care.  Adline Potter, NP 02/16/2020 10:40 AM

## 2020-02-16 NOTE — Progress Notes (Signed)
   NURSE VISIT- PREGNANCY CONFIRMATION   SUBJECTIVE:  Alisha Norman is a 20 y.o. G33P0000 female at [redacted]w[redacted]d by certain LMP of Patient's last menstrual period was 01/10/2020. Here for pregnancy confirmation.  Home pregnancy test: positive x 6  She reports cramping.  She is taking prenatal vitamins.    OBJECTIVE:  BP 140/87 (BP Location: Left Arm, Patient Position: Sitting, Cuff Size: Normal)   Pulse (!) 108   Ht 5\' 8"  (1.727 m)   Wt 199 lb (90.3 kg)   LMP 01/10/2020   BMI 30.26 kg/m   Appears well, in no apparent distress OB History  Gravida Para Term Preterm AB Living  1 0 0 0 0 0  SAB IAB Ectopic Multiple Live Births  0 0 0 0 0    # Outcome Date GA Lbr Len/2nd Weight Sex Delivery Anes PTL Lv  1 Current             No results found for this or any previous visit (from the past 24 hour(s)).  ASSESSMENT: Positive pregnancy test, [redacted]w[redacted]d by LMP    PLAN: Schedule for dating ultrasound in 4 weeks Prenatal vitamins: continue   Nausea medicines: not currently needed   OB packet given: Yes  [redacted]w[redacted]d  02/16/2020 10:16 AM

## 2020-02-24 ENCOUNTER — Telehealth: Payer: Self-pay | Admitting: Women's Health

## 2020-02-24 NOTE — Telephone Encounter (Signed)
Patient called stating that she is early pregnant she is coming in on 03/15/2020 for a dating [place], but last night patient states that she went to the restroom and wiped and she had some slight bleeding. Not a lot and not big just light blood. Pt is scared and would like a call back. Please contact pt

## 2020-03-14 ENCOUNTER — Other Ambulatory Visit: Payer: Self-pay | Admitting: Obstetrics & Gynecology

## 2020-03-14 DIAGNOSIS — O3680X0 Pregnancy with inconclusive fetal viability, not applicable or unspecified: Secondary | ICD-10-CM

## 2020-03-15 ENCOUNTER — Ambulatory Visit (INDEPENDENT_AMBULATORY_CARE_PROVIDER_SITE_OTHER): Payer: Managed Care, Other (non HMO)

## 2020-03-15 ENCOUNTER — Other Ambulatory Visit: Payer: Self-pay

## 2020-03-15 DIAGNOSIS — O3680X0 Pregnancy with inconclusive fetal viability, not applicable or unspecified: Secondary | ICD-10-CM | POA: Diagnosis not present

## 2020-03-15 DIAGNOSIS — Z3A09 9 weeks gestation of pregnancy: Secondary | ICD-10-CM

## 2020-03-15 NOTE — Progress Notes (Signed)
[place] 9+2 wks,single IUP w/ys,FHR 162 bpm,normal ovaries,CRL 26.30 mm

## 2020-03-16 ENCOUNTER — Other Ambulatory Visit: Payer: Self-pay | Admitting: Women's Health

## 2020-03-16 ENCOUNTER — Telehealth: Payer: Self-pay | Admitting: Women's Health

## 2020-03-16 MED ORDER — BONJESTA 20-20 MG PO TBCR: 1.0000 | EXTENDED_RELEASE_TABLET | Freq: Every day | ORAL | 8 refills | Status: DC

## 2020-03-16 NOTE — Telephone Encounter (Signed)
Patient called stating that she has a question regarding nausea medication and would like a mychart message from the nurse. Please contact pt

## 2020-03-16 NOTE — Telephone Encounter (Signed)
Returned pt's call. Pt stated that she needed some nausea med. Informed her that I would send a request to a provider. Instructed her to try the OTC Vitamin B6 and Unisom in her new OB booklet. Pt said she would try that. Pt confirmed understanding.

## 2020-04-03 ENCOUNTER — Other Ambulatory Visit: Payer: Self-pay | Admitting: Obstetrics & Gynecology

## 2020-04-03 DIAGNOSIS — Z3682 Encounter for antenatal screening for nuchal translucency: Secondary | ICD-10-CM

## 2020-04-04 ENCOUNTER — Ambulatory Visit (INDEPENDENT_AMBULATORY_CARE_PROVIDER_SITE_OTHER): Payer: Managed Care, Other (non HMO)

## 2020-04-04 ENCOUNTER — Other Ambulatory Visit: Payer: Managed Care, Other (non HMO)

## 2020-04-04 ENCOUNTER — Other Ambulatory Visit: Payer: Self-pay

## 2020-04-04 DIAGNOSIS — Z3A12 12 weeks gestation of pregnancy: Secondary | ICD-10-CM

## 2020-04-04 DIAGNOSIS — Z3682 Encounter for antenatal screening for nuchal translucency: Secondary | ICD-10-CM | POA: Diagnosis not present

## 2020-04-04 DIAGNOSIS — Z3401 Encounter for supervision of normal first pregnancy, first trimester: Secondary | ICD-10-CM

## 2020-04-04 DIAGNOSIS — Z3400 Encounter for supervision of normal first pregnancy, unspecified trimester: Secondary | ICD-10-CM | POA: Insufficient documentation

## 2020-04-04 NOTE — Progress Notes (Signed)
[place] 12+1 wks,measurements c/w dates,CRL 62.86 mm,posterior placenta,NB present,NT 1.2 mm,fhr 149 bpm

## 2020-04-09 ENCOUNTER — Ambulatory Visit: Payer: Managed Care, Other (non HMO) | Admitting: *Deleted

## 2020-04-09 ENCOUNTER — Other Ambulatory Visit: Payer: Self-pay

## 2020-04-09 ENCOUNTER — Encounter: Payer: Managed Care, Other (non HMO) | Admitting: Women's Health

## 2020-04-09 ENCOUNTER — Encounter (HOSPITAL_COMMUNITY): Payer: Self-pay | Admitting: Emergency Medicine

## 2020-04-09 ENCOUNTER — Emergency Department (HOSPITAL_COMMUNITY)
Admission: EM | Admit: 2020-04-09 | Discharge: 2020-04-09 | Disposition: A | Discharge status: Home / Self Care | Payer: Managed Care, Other (non HMO) | Attending: Emergency Medicine | Admitting: Emergency Medicine

## 2020-04-09 DIAGNOSIS — O26811 Pregnancy related exhaustion and fatigue, first trimester: Secondary | ICD-10-CM | POA: Insufficient documentation

## 2020-04-09 DIAGNOSIS — O23591 Infection of other part of genital tract in pregnancy, first trimester: Secondary | ICD-10-CM | POA: Diagnosis not present

## 2020-04-09 DIAGNOSIS — L02214 Cutaneous abscess of groin: Secondary | ICD-10-CM

## 2020-04-09 DIAGNOSIS — R55 Syncope and collapse: Secondary | ICD-10-CM | POA: Diagnosis not present

## 2020-04-09 DIAGNOSIS — Z3A13 13 weeks gestation of pregnancy: Secondary | ICD-10-CM | POA: Insufficient documentation

## 2020-04-09 LAB — CBC WITH DIFFERENTIAL/PLATELET
Abs Immature Granulocytes: 0.04 10*3/uL (ref 0.00–0.07)
Basophils Absolute: 0 10*3/uL (ref 0.0–0.1)
Basophils Relative: 0 %
Eosinophils Absolute: 0.1 10*3/uL (ref 0.0–0.5)
Eosinophils Relative: 1 %
HCT: 32.8 % — ABNORMAL LOW (ref 36.0–46.0)
Hemoglobin: 10.4 g/dL — ABNORMAL LOW (ref 12.0–15.0)
Immature Granulocytes: 0 %
Lymphocytes Relative: 15 %
Lymphs Abs: 1.4 10*3/uL (ref 0.7–4.0)
MCH: 27.6 pg (ref 26.0–34.0)
MCHC: 31.7 g/dL (ref 30.0–36.0)
MCV: 87 fL (ref 80.0–100.0)
Monocytes Absolute: 0.6 10*3/uL (ref 0.1–1.0)
Monocytes Relative: 7 %
Neutro Abs: 7.1 10*3/uL (ref 1.7–7.7)
Neutrophils Relative %: 77 %
Platelets: 364 10*3/uL (ref 150–400)
RBC: 3.77 MIL/uL — ABNORMAL LOW (ref 3.87–5.11)
RDW: 13.2 % (ref 11.5–15.5)
WBC: 9.2 10*3/uL (ref 4.0–10.5)
nRBC: 0 % (ref 0.0–0.2)

## 2020-04-09 LAB — URINALYSIS, ROUTINE W REFLEX MICROSCOPIC
Bilirubin Urine: NEGATIVE
Glucose, UA: NEGATIVE mg/dL
Hgb urine dipstick: NEGATIVE
Ketones, ur: 20 mg/dL — AB
Nitrite: NEGATIVE
Protein, ur: NEGATIVE mg/dL
Specific Gravity, Urine: 1.012 (ref 1.005–1.030)
pH: 6 (ref 5.0–8.0)

## 2020-04-09 LAB — CBG MONITORING, ED
Glucose-Capillary: 81 mg/dL (ref 70–99)
Glucose-Capillary: 93 mg/dL (ref 70–99)

## 2020-04-09 LAB — BASIC METABOLIC PANEL
Anion gap: 9 (ref 5–15)
BUN: 7 mg/dL (ref 6–20)
CO2: 22 mmol/L (ref 22–32)
Calcium: 9.2 mg/dL (ref 8.9–10.3)
Chloride: 102 mmol/L (ref 98–111)
Creatinine, Ser: 0.58 mg/dL (ref 0.44–1.00)
GFR, Estimated: >60 mL/min (ref >60)
Glucose, Bld: 78 mg/dL (ref 70–99)
Potassium: 3.4 mmol/L — ABNORMAL LOW (ref 3.5–5.1)
Sodium: 133 mmol/L — ABNORMAL LOW (ref 135–145)

## 2020-04-09 MED ORDER — CLINDAMYCIN HCL 300 MG PO CAPS: 300.0000 mg | ORAL_CAPSULE | Freq: Three times a day (TID) | ORAL | 0 refills | Status: AC

## 2020-04-09 MED ORDER — SODIUM CHLORIDE 0.9 % IV BOLUS
1000.0000 mL | Freq: Once | INTRAVENOUS | Status: AC
  Administered 2020-04-09: 1000 mL via INTRAVENOUS

## 2020-04-09 MED ORDER — LIDOCAINE HCL (PF) 1 % IJ SOLN
5.0000 mL | Freq: Once | INTRAMUSCULAR | Status: AC
  Administered 2020-04-09: 5 mL
  Filled 2020-04-09: qty 30

## 2020-04-09 MED ORDER — PROMETHAZINE HCL 25 MG/ML IJ SOLN
12.5000 mg | Freq: Once | INTRAMUSCULAR | Status: AC
  Administered 2020-04-09: 12.5 mg via INTRAVENOUS
  Filled 2020-04-09: qty 1

## 2020-04-09 NOTE — ED Notes (Addendum)
Entered room and introduced self to patient. Pt appears to be resting in bed, respirations are even and unlabored with equal chest rise and fall. Bed is locked in the lowest position, side rails x2, call bell within reach. Pt educated on call light use and hourly rounding, verbalized understanding and in agreement at this time. All questions and concerns voiced addressed. Refreshments offered and provided per patient request.  

## 2020-04-09 NOTE — Discharge Instructions (Addendum)
Warm compress to your labia.  Follow-up with OB/GYN for wound recheck in 2 days  Drink plenty of fluids and rest  Return for any worsening symptoms

## 2020-04-09 NOTE — ED Notes (Addendum)
LATE ENTRY FOR 1810:  Pt and family rang for staff. Upon entering room, this RN notes that patient is pale and diaphoretic. Family and patient report that pt was attempting to stand up and get dressed when pt suddenly became dizzy, nausea and felt "like I was gonna pass out again." Pt in bed at the time of assessment.  Pt is alert and oriented, answering questions correctly. Pt provided with a cold wash cloth per request and PA made aware. Bed remains locked in the lowest position, side rails x2, call bell with reach and family at bedside.  CBG obtained and pt provided with graham crackers and a ginger ale at this time. Pt currently tolerating PO intake without distress, no nausea or vomiting reported.

## 2020-04-09 NOTE — ED Provider Notes (Signed)
  Physical Exam  BP 122/71 (BP Location: Right Arm)   Pulse 93   Temp 98.3 F (36.8 C) (Oral)   Resp 19   Ht 5\' 8"  (1.727 m)   Wt 90.3 kg   LMP 01/10/2020   SpO2 100%   BMI 30.26 kg/m   Physical Exam  ED Course/Procedures     Procedures  MDM   Care of patient assumed from previous ED provider Britni Henderly,, PA-C at time of shift change.  All pertinent HPI, physical exam, laboratory findings were reviewed with me prior to her departure.  Please see her note for further details of patient's ED course.  In brief, patient is [redacted] weeks pregnant who presented emergency department after syncopal episode at work.  Additionally she was found to have labial abscess.  Patient is history of syncope in the past and reassuring work-up today in the emergency department.  Unfortunately when this patient was standing up out of her bed to be discharged home this evening from the emergency department she became lightheaded, dizzy, and diaphoretic.  CBG was 81 at time; patient was administered food and drink is awaiting reassessment by me at this time.  She has no chest pain, shortness of breath, abdominal pain, or headache associated with her symptoms.   Patient is currently finishing her snacks; will reevaluate her several minutes after she finishes eating.  If she is asymptomatic and without further syncope at that time she may be discharged home with close outpatient follow-up with her OB/GYN and primary care doctor.  Patient is feeling much better after food and drink. She denies any lightheadedness, shortness of breath, chest pain, palpitations, nausea, vomiting, or dizziness at this time.  Patient ambulated around the room without difficulty her vital signs have remained normal throughout her stay in the emergency department this evening.  At this time I do feel any further work-up is warranted and she is safe to be discharged home with close OB follow-up for syncopal episodes as well as her labial  abscess.  Alisha Norman voiced understanding of her medical evaluation and treatment plan.  Each of her questions was answered to her expressed satisfaction.  Return precautions given.  Patient is well-appearing, stable, and appropriate for discharge at this time.  This chart was dictated using voice recognition software, Dragon. Despite the best efforts of this provider to proofread and correct errors, errors may still occur which can change documentation meaning.       01/12/2020 04/09/20 2026    2027, MD 04/10/20 [phone]

## 2020-04-09 NOTE — ED Provider Notes (Addendum)
[facility] EMERGENCY DEPARTMENT Provider Note   CSN: [id number] Arrival date & time: 04/09/20  1152    History Chief Complaint  Patient presents with  . Loss of Consciousness    Alisha Norman is a 20 y.o. female G1, P0 approximately [redacted]w[redacted]d preg resents for evaluation of syncope while at work.  History of same however has been many years since prior syncope.  Had unclear etiology of her syncopal episodes.  Patient states she felt "hot nauseous" at work.  She went to walk to the restroom and had a syncopal event.  She was caught by her coworker and lowered to the ground.  No seizure-like activity.  No tongue biting or bladder incontinence.  Has noted some nausea at home.  She is currently taking Diclegis.  States this has helped however does occasionally have "breakthrough nausea."  No recent illnesses.  No preceding sudden onset medical headache, neck pain, chest pain, shortness of breath, abdominal pain.  Denied any diarrhea, dysuria, hematuria, weakness, vaginal bleeding, fluid leakage, cramping.  She has had 2 ultrasounds this pregnancy thus far.  Last ultrasound 3 days ago with single IUP.  She denies any pregnancy complications aside from some nausea.  She has no current complaints.  Denies additional aggravating or alleviating factors. Followed by [facility].  History obtained from patient and past medical records.  No interpreter used.  HPI     Past Medical History:  Diagnosis Date  . Amnesia   . Concussion   . Dermatitis   . Enlarged liver   . Headache   . Hepatitis     Patient Active Problem List   Diagnosis Date Noted  . Supervision of normal first pregnancy 04/04/2020    History reviewed. No pertinent surgical history.   OB History    Gravida  1   Para  0   Term  0   Preterm  0   AB  0   Living  0     SAB  0   IAB  0   Ectopic  0   Multiple  0   Live Births  0           Family History  Problem Relation Age of Onset  . Heart attack  Paternal Grandfather   . Diabetes Maternal Grandmother     Social History   Tobacco Use  . Smoking status: Never Smoker  . Smokeless tobacco: Never Used  Vaping Use  . Vaping Use: Never used  Substance Use Topics  . Alcohol use: No  . Drug use: No    Home Medications Prior to Admission medications   Medication Sig Start Date End Date Taking? Authorizing Provider  clindamycin (CLEOCIN) 300 MG capsule Take 1 capsule (300 mg total) by mouth 3 (three) times daily for 5 days. 04/09/20 04/14/20 Yes Henderly, Britni A, PA-C  Doxylamine-Pyridoxine ER (BONJESTA) 20-20 MG TBCR Take 1 tablet by mouth at bedtime. Can add 1 tablet in the morning if needed for nausea and vomiting 03/16/20   Cheral Marker, CNM  Prenatal Vit-Fe Fumarate-FA (PRENATAL VITAMIN PO) Take by mouth daily.    [provider]    Allergies    Codeine and Penicillins  Review of Systems   Review of Systems  Constitutional: Negative.   HENT: Negative.   Respiratory: Negative.   Cardiovascular: Negative.   Gastrointestinal: Negative.   Genitourinary: Negative.   Musculoskeletal: Negative.   Skin: Negative.   Neurological: Positive for syncope. Negative for dizziness,  tremors, seizures, facial asymmetry, speech difficulty, weakness, light-headedness, numbness and headaches.  All other systems reviewed and are negative.   Physical Exam Updated Vital Signs BP 130/81 (BP Location: Right Arm)   Pulse 99   Temp 98.6 F (37 C) (Oral)   Resp 18   Ht 5\' 8"  (1.727 m)   Wt 90.3 kg   LMP 01/10/2020   SpO2 100%   BMI 30.26 kg/m   Physical Exam Vitals and nursing note reviewed. Exam conducted with a chaperone present.  Constitutional:      General: She is not in acute distress.    Appearance: She is well-developed and well-nourished. She is not ill-appearing, toxic-appearing or diaphoretic.  HENT:     Head: Normocephalic and atraumatic.     Nose: Nose normal.     Mouth/Throat:     Mouth: Mucous membranes  are moist.  Eyes:     Pupils: Pupils are equal, round, and reactive to light.  Cardiovascular:     Rate and Rhythm: Normal rate.     Pulses: Normal pulses and intact distal pulses.     Heart sounds: Normal heart sounds.  Pulmonary:     Effort: Pulmonary effort is normal. No respiratory distress.     Breath sounds: Normal breath sounds.  Abdominal:     General: Bowel sounds are normal. There is no distension.     Tenderness: There is no abdominal tenderness. There is no right CVA tenderness, left CVA tenderness, guarding or rebound.  Genitourinary:      Comments: 4 cm rounded right labial, right inguinal crease abscess. 1cm area of fluctuance. No induration. Musculoskeletal:        General: Normal range of motion.     Cervical back: Normal range of motion.  Skin:    General: Skin is warm and dry.     Capillary Refill: Capillary refill takes less than 2 seconds.  Neurological:     General: No focal deficit present.     Mental Status: She is alert and oriented to person, place, and time.  Psychiatric:        Mood and Affect: Mood and affect normal.    ED Results / Procedures / Treatments   Labs (all labs ordered are listed, but only abnormal results are displayed) Labs Reviewed  CBC WITH DIFFERENTIAL/PLATELET - Abnormal; Notable for the following components:      Result Value   RBC 3.77 (*)    Hemoglobin 10.4 (*)    HCT 32.8 (*)    All other components within normal limits  BASIC METABOLIC PANEL - Abnormal; Notable for the following components:   Sodium 133 (*)    Potassium 3.4 (*)    All other components within normal limits  URINALYSIS, ROUTINE W REFLEX MICROSCOPIC - Abnormal; Notable for the following components:   APPearance HAZY (*)    Ketones, ur 20 (*)    Leukocytes,Ua MODERATE (*)    Bacteria, UA RARE (*)    All other components within normal limits  CBG MONITORING, ED    EKG None  Radiology No results found.  Procedures .Marland Kitchen.Incision and  Drainage  Date/Time: 04/09/2020 5:42 PM Performed by: Linwood DibblesHenderly, Britni A, PA-C Authorized by: Linwood DibblesHenderly, Britni A, PA-C   Consent:    Consent obtained:  Verbal   Consent given by:  Patient   Risks discussed:  Bleeding, incomplete drainage, pain and damage to other organs   Alternatives discussed:  No treatment Universal protocol:    Procedure explained and questions  answered to patient or proxy's satisfaction: yes     Relevant documents present and verified: yes     Test results available : yes     Imaging studies available: yes     Required blood products, implants, devices, and special equipment available: yes     Site/side marked: yes     Immediately prior to procedure, a time out was called: yes     Patient identity confirmed:  Verbally with patient Location:    Type:  Abscess (Right labial, inguinal crease)   Size:  4cm   Location:  Anogenital   Anogenital location:  Vulva Pre-procedure details:    Skin preparation:  Betadine Anesthesia:    Anesthesia method:  Local infiltration   Local anesthetic:  Lidocaine 1% w/o epi Procedure type:    Complexity:  Complex Procedure details:    Ultrasound guidance: no     Needle aspiration: no     Incision types:  Single straight   Incision depth:  Subcutaneous   Scalpel blade:  11   Wound management:  Probed and deloculated, irrigated with saline and extensive cleaning   Drainage:  Purulent   Drainage amount:  Copious   Wound treatment:  Wound left open   Packing materials:  None Post-procedure details:    Procedure completion:  Tolerated well, no immediate complications     Medications Ordered in ED Medications  sodium chloride 0.9 % bolus 1,000 mL (0 mLs Intravenous Stopped 04/09/20 1600)  promethazine (PHENERGAN) injection 12.5 mg (12.5 mg Intravenous Given 04/09/20 1356)  lidocaine (PF) (XYLOCAINE) 1 % injection 5 mL (5 mLs Infiltration Given by Other 04/09/20 1711)   ED Course  I have reviewed the triage vital signs and  the nursing notes.  Pertinent labs & imaging results that were available during my care of the patient were reviewed by me and considered in my medical decision making (see chart for details).  20 year old presents for evaluation of syncope. Approximately [redacted]w[redacted]d. Has had some nausea currently on meds. Did feel hot and lightheaded prior to syncope.  Was lowered to the ground by coworker, did not hit her abdomen. Has had no complaints with the pregnancy. No abdominal pain, pelvic pain, vaginal discharge or fluid leakage. She has a nonfocal neuro exam without deficits.  History of syncope however of unknown etiology.  We will plan on checking labs, fluids, treating her nausea and reassess.  Do not feel she needs ultrasound at this time as ultrasound 3 days ago showed single IUP she denies any abdominal or pelvic complaints.  Labs and reassess  Labs and imaging personally reviewed and interpreted:  CBC without leukocytosis BMP mild hyponatremia 133, potassium 3.4, no additional electrolyte, renal abnormality UA negative for infection EKG WO ischemic changes  Patient reassessed.  To reassess patient.  Feels significant improved with IV fluids.  Discussed reassuring work-up here in ED.  Tentative discharge patient however she asked me to look at a "boil" to her right labia, inguinal crease.She has a right labial abscess.  Approximately 4 cm in her mounded.  Will attempt I&D.  She is up-to-date on Tdap.  See procedure note.  Tolerated well.  Copious purulent drainage.  Discussed warm soaks, antibiotics, will follow up with OB.  Regards to her syncope.  Seems vasovagal in nature.  Feels significantly improved after IV fluids.  Discussed rest, rehydration, follow-up for any new or worsening symptoms.  Patient without arrhythmia or tachycardia while here in the department.  Patient without history  of congestive heart failure, normal hematocrit, normal ECG, no shortness of breath and systolic blood pressure  greater than 90; patient is low risk.  The patient has been appropriately medically screened and/or stabilized in the ED. I have low suspicion for any other emergent medical condition which would require further screening, evaluation or treatment in the ED or require inpatient management.  Patient is hemodynamically stable and in no acute distress.  Patient able to ambulate in department prior to ED.  Evaluation does not show acute pathology that would require ongoing or additional emergent interventions while in the emergency department or further inpatient treatment.  I have discussed the diagnosis with the patient and answered all questions.  Pain is been managed while in the emergency department and patient has no further complaints prior to discharge.  Patient is comfortable with plan discussed in room and is stable for discharge at this time.  I have discussed strict return precautions for returning to the emergency department.  Patient was encouraged to follow-up with PCP/specialist refer to at discharge.  ADDEND: 1815 Patient went to stand and get dressed. Within a few minutes became lightheaded, dizzy and diaphoretic. CBG 81, will give food and drink and reassess. No preceding CP, SOB, abd pain, HA.  Care transferred to [facility], PA-C who will follow up on reevaluate. If feeling improved no further near syncope dc home with close outpatient follow up.      MDM Rules/Calculators/A&P                           Final Clinical Impression(s) / ED Diagnoses Final diagnoses:  Syncope, unspecified syncope type  [redacted] weeks gestation of pregnancy  Inguinal abscess    Rx / DC Orders ED Discharge Orders         Ordered    clindamycin (CLEOCIN) 300 MG capsule  3 times daily        04/09/20 1740           Henderly, Britni A, PA-C 04/09/20 1744    Henderly, Britni A, PA-C 04/09/20 1850    Rozelle Logan, DO 04/09/20 1934

## 2020-04-09 NOTE — ED Notes (Signed)
ED Provider at bedside. 

## 2020-04-09 NOTE — ED Triage Notes (Addendum)
Pt was standing at work. Stated her coworker told her she passed out, caught her and laid her to the ground. Denies any pain, vaginal bleeding or cramping. Pt is [redacted] weeks pregnant. Pt also states she has a boil on her inner thigh she would like to be treated for

## 2020-04-11 ENCOUNTER — Telehealth: Payer: Self-pay | Admitting: Women's Health

## 2020-04-11 NOTE — Telephone Encounter (Signed)
Pt was unable to keep her new OB appt 2/28 due to fainted & went to the ED (seen [facility])  ED doc recommended her see [place] this week, told her she may be having problems with her sugar  Please advise - no available appointments until 3/15 or later

## 2020-04-11 NOTE — Telephone Encounter (Signed)
Spoke to patient.  Patient states she is feeling better since being seen in the ED on 2/28.  Discussed importance of staying hydrated and eating small, frequent, high protein snacks throughout the day to help maintain blood sugar levels.  Advised if episodes continue, to let us know.  EKG normal but may need to have Holter monitor during pregnancy but would be decided by provider.  Pt verbalized understanding.  New ob appt rescheduled.

## 2020-04-13 ENCOUNTER — Ambulatory Visit
Admission: EM | Admit: 2020-04-13 | Discharge: 2020-04-13 | Disposition: A | Discharge status: Home / Self Care | Payer: Managed Care, Other (non HMO)

## 2020-04-13 ENCOUNTER — Other Ambulatory Visit: Payer: Self-pay

## 2020-04-13 NOTE — ED Notes (Signed)
Patient is being discharged from the Urgent Care and sent to the Emergency Department via pov . Per B. Wurst, patient is in need of higher level of care due to ob pt dizziness . Patient is aware and verbalizes understanding of plan of care. There were no vitals filed for this visit.

## 2020-04-13 NOTE — ED Triage Notes (Signed)
Pt states he was seen at the ED and discharged  on 04/09/20 for syncopal episode, dizziness.  She is having some similar s/s at this time.

## 2020-04-25 ENCOUNTER — Other Ambulatory Visit: Payer: Self-pay

## 2020-04-25 ENCOUNTER — Ambulatory Visit (INDEPENDENT_AMBULATORY_CARE_PROVIDER_SITE_OTHER): Payer: Managed Care, Other (non HMO) | Admitting: Advanced Practice Midwife

## 2020-04-25 VITALS — BP 129/75 | HR 114 | Wt 201.0 lb

## 2020-04-25 DIAGNOSIS — Z363 Encounter for antenatal screening for malformations: Secondary | ICD-10-CM | POA: Diagnosis not present

## 2020-04-25 DIAGNOSIS — Z3A15 15 weeks gestation of pregnancy: Secondary | ICD-10-CM

## 2020-04-25 DIAGNOSIS — Z3402 Encounter for supervision of normal first pregnancy, second trimester: Secondary | ICD-10-CM | POA: Diagnosis not present

## 2020-04-25 LAB — POCT URINALYSIS DIPSTICK OB
Blood, UA: NEGATIVE
Glucose, UA: NEGATIVE
Ketones, UA: NEGATIVE
Leukocytes, UA: NEGATIVE
Nitrite, UA: NEGATIVE
POC,PROTEIN,UA: NEGATIVE

## 2020-04-25 NOTE — Progress Notes (Signed)
INITIAL OBSTETRICAL VISIT Patient name: Alisha Norman MRN [id number]  Date of birth: 2000/06/30 Chief Complaint:   Initial Prenatal Visit  History of Present Illness:   Alisha Norman is a 20 y.o. G83P0000 African American female at [redacted]w[redacted]d by LMP c/w u/s at 9.3 weeks with an Estimated Date of Delivery: 10/16/20 being seen today for her initial obstetrical visit.   Her obstetrical history is significant for G1.   Today she reports syncopal episode approx 2wks ago>had nl ED visit and has been feeling better since then.  Depression screen [facility] 2/9 04/25/2020 10/25/2019 03/23/2017  Decreased Interest 0 0 0  Down, Depressed, Hopeless 0 0 0  PHQ - 2 Score 0 0 0  Altered sleeping 0 0 -  Tired, decreased energy 1 0 -  Change in appetite 0 0 -  Feeling bad or failure about yourself  0 0 -  Trouble concentrating 0 0 -  Moving slowly or fidgety/restless 0 0 -  Suicidal thoughts 0 0 -  PHQ-9 Score 1 0 -    Patient's last menstrual period was 01/10/2020. Last pap Sept 2021. Results were: NILM w/ HRHPV not done Review of Systems:   Pertinent items are noted in HPI Denies cramping/contractions, leakage of fluid, vaginal bleeding, abnormal vaginal discharge w/ itching/odor/irritation, headaches, visual changes, shortness of breath, chest pain, abdominal pain, severe nausea/vomiting, or problems with urination or bowel movements unless otherwise stated above.  Pertinent History Reviewed:  Reviewed past medical,surgical, social, obstetrical and family history.  Reviewed problem list, medications and allergies. OB History  Gravida Para Term Preterm AB Living  1 0 0 0 0 0  SAB IAB Ectopic Multiple Live Births  0 0 0 0 0    # Outcome Date GA Lbr Len/2nd Weight Sex Delivery Anes PTL Lv  1 Current            Physical Assessment:   Vitals:   04/25/20 1439  BP: 129/75  Pulse: (!) 114  Weight: 201 lb (91.2 kg)  Body mass index is 30.56 kg/m.       Physical Examination:  General appearance - well  appearing, and in no distress  Mental status - alert, oriented to person, place, and time  Psych:  She has a normal mood and affect  Skin - warm and dry, normal color, no suspicious lesions noted  Chest - effort normal, all lung fields clear to auscultation bilaterally  Heart - normal rate and regular rhythm  Abdomen - soft, nontender; FHT doppler 144bpm  Extremities:  No swelling or varicosities noted  Pelvic - not indicted  Thin prep pap is not done    NT from 04/04/20 (didn't get labs): [place] 12+1 wks,measurements c/w dates,CRL 62.86 mm,posterior placenta,NB present,NT 1.2 mm,fhr 149 bpm  Results for orders placed or performed in visit on 04/25/20 (from the past 24 hour(s))  POC Urinalysis Dipstick OB   Collection Time: 04/25/20  2:43 PM  Result Value Ref Range   Color, UA     Clarity, UA     Glucose, UA Negative Negative   Bilirubin, UA     Ketones, UA neg    Spec Grav, UA     Blood, UA neg    pH, UA     POC,PROTEIN,UA Negative Negative, Trace, Small (1+), Moderate (2+), Large (3+), 4+   Urobilinogen, UA     Nitrite, UA neg    Leukocytes, UA Negative Negative   Appearance     Odor  Assessment & Plan:  1) Low-Risk Pregnancy G1P0000 at [redacted]w[redacted]d with an Estimated Date of Delivery: 10/16/20   2) Initial OB visit  3) Syncopal episode, recommended eat small amts of protein throughout the day and stay hydrated  4) Interested in doula, list given  Meds: No orders of the defined types were placed in this encounter.   Initial labs obtained Continue prenatal vitamins Reviewed n/v relief measures and warning s/s to report Reviewed recommended weight gain based on pre-gravid BMI Encouraged well-balanced diet Genetic & carrier screening discussed: requests Panorama and Horizon 14 , declines NT/IT Ultrasound discussed; fetal survey: requested CCNC completed> form faxed if has or is planning to apply for medicaid The nature of [facility] [employer] with  multiple MDs and other Advanced Practice Providers was explained to patient; also emphasized that fellows, residents, and students are part of our team. Doesn't have home bp cuff- Tish informed and will get her one. Check bp weekly, let us know if >140/90.   No indications for ASA therapy or early HgbA1c (per uptodate)  Follow-up: Return in about 4 weeks (around 05/23/2020) for LROB, in person, [place]: Anatomy.   Orders Placed This Encounter  Procedures  . Urine Culture  . GC/Chlamydia Probe Amp  . US OB Comp + 14 Wk  . Pain Management Screening Profile (10S)  . CBC/D/Plt+RPR+Rh+ABO+Rub Ab...  . POC Urinalysis Dipstick OB    Arabella Merles [facility] 04/25/2020 3:39 PM

## 2020-04-25 NOTE — Addendum Note (Signed)
Addended by: Annamarie Dawley on: 04/25/2020 04:59 PM   Modules accepted: Orders

## 2020-04-25 NOTE — Patient Instructions (Addendum)
Alisha Norman, I greatly value your feedback.  If you receive a survey following your visit with [place] today, we appreciate you taking the time to fill it out.  Thanks, Philipp Deputy CNM   DOULA LIST   [facility] Doula  [place]  [phone]  Raoul [email or web address]  [email or web address]  Zula the [employer] Price [phone]  [email or web address]   [employer]   Precious Danford Bad   [email or web address]   ??THE MOTHERLY DOULA?? Zola Button   [phone]   [email or web address] .[email or web address]     [facility] Doula  Olive Bass  [phone]    [email or web address]   [facility] [facility]  Angie Rosier     [phone]     [email or web address]   Renato Gails: Doula & Photographer   Renato Gails [phone]       [email or web address]  [email or web address]   [employer] [facility]  [place] [phone]   [email or web address] [place], [place]  Lolita Rieger  [phone]  [email or web address]   [email or web address]   [facility]   Kizzie Furnish   [phone]  [email or web address]   [facility] Doula  Dina Rich  [phone] [email or web address]  [facility] Doulas  Cornelious Bryant         [facility]       Lora Reynolds     [phone] [email or web address]  [email or web address]   [facility]   [place] [phone] [email or web address]   [facility]  Camelia Eng     [phone]  [email or web address]  [facility]     [phone]  [email or web address]  Facebook and IG [email or web address] .  [phone] [email or web address] .[phone] [email or web address]   [phone]  [phone] [email or web address]     [facility]  [phone] [email or web address]   IG [email or web address]    [phone]    [phone] [email or web address]   Lurline Hare "[place]" Free  [phone] [email or web address]    Mtende Roll  [phone] [email or web address]   [phone]   [email or web address]    [phone]    [phone] [email or web address]     Denita Lung  [phone] [email or web address]    Lenard Galloway  [phone] [email or web address]    [place]    [phone]   [facility] Red wing  [phone] [email or web address]  Doula Mama [phone] [email or web address]  [facility] Maryjean Ka  [phone] [email or web address]  [facility] [phone]  [email or web address]  [email or web address] Montgomery Harris-Jones  [phone] [email or web address] .[email or web address]   Allen Kell [phone] [email or web address] .Ardine Eng   [phone] [email or web address]   Gilda Crease  [phone] [email or web address]  [employer]  Jonelle Sports  [phone] [email or web address]   Jackelyn Poling [phone] [email or web address]  MOOR Divine Myeka Dunn  [email or web address] .[email or web address]   Cristina Gong [phone] [email or web address]   Glory Buff [phone] [email or web address]   Whitney Muse [phone] [email or web address]          [facility] ([address] [place], [address] [place]) Entrance C, located off of [address] 24/7 valet parking   Nausea & Vomiting  Have saltine crackers or pretzels by your bed and eat a few bites before you raise your head out of bed in the morning  Eat small frequent meals throughout the day instead of large meals  Drink plenty of fluids  throughout the day  to stay hydrated, just don't drink a lot of fluids with your meals.  This can make your stomach fill up faster making you feel sick  Do not brush your teeth right after you eat  Products with real ginger are good for nausea, like ginger ale and ginger hard candy Make sure it says made with real ginger!  Sucking on sour candy like lemon heads is also good for nausea  If your prenatal vitamins make you nauseated, take them at night so you will sleep through the nausea  Sea Bands  If you feel like you need medicine for the nausea & vomiting please let us know  If you are unable to keep any fluids or food down please let us know   Constipation  Drink plenty of fluid, preferably water, throughout the day  Eat foods high in fiber such as fruits, vegetables, and grains  Exercise, such as walking, is a good way to keep your bowels regular  Drink warm fluids, especially warm prune juice, or decaf coffee  Eat a 1/2 cup of real oatmeal (not instant), 1/2 cup applesauce, and 1/2-1 cup warm prune juice every day  If needed, you may take Colace (docusate sodium) stool softener once or twice a day to help keep the stool soft.   If you still are having problems with constipation, you may take Miralax once daily as needed to help keep your bowels regular.   Home Blood Pressure Monitoring for Patients   Your provider has recommended that you check your blood pressure (BP) at least once a week at home. If you do not have a blood pressure cuff at home, one will be provided for you. Contact your provider if you have not received your monitor within 1 week.   Helpful Tips for Accurate Home Blood Pressure Checks  . Don't smoke, exercise, or drink caffeine 30 minutes before checking your BP . Use the restroom before checking your BP (a full bladder can raise your pressure) . Relax in a comfortable upright chair . Feet on the ground . Left arm resting comfortably on a flat surface  at the level of your heart . Legs uncrossed . Back supported . Sit quietly and don't talk . Place the cuff on your bare arm . Adjust snuggly, so that only two fingertips can fit between your skin and the top of the cuff . Check 2 readings separated by at least one minute . Keep a log of your BP readings . For a visual, please reference this diagram: http://ccnc.care/bpdiagram  Provider Name: [facility]     Phone: [phone]  Zone 1: ALL CLEAR  Continue to monitor your symptoms:  . BP reading is less than 140 (top number) or less than 90 (bottom number)  . No right upper stomach pain . No headaches or seeing spots . No feeling nauseated or throwing up . No swelling in face and hands  Zone 2: CAUTION Call your doctor's office for any of the following:  . BP reading is greater than 140 (top number) or greater than 90 (bottom number)  . Stomach pain under your ribs in the middle or right side . Headaches or seeing spots . Feeling nauseated or throwing up . Swelling in face and hands  Zone 3: EMERGENCY  Seek immediate medical care if you have any of the following:  . BP reading is greater than160 (top number) or greater than 110 (bottom number) . Severe headaches not improving with Tylenol .  Serious difficulty catching your breath . Any worsening symptoms from Zone 2    First Trimester of Pregnancy The first trimester of pregnancy is from week 1 until the end of week 12 (months 1 through 3). A week after a sperm fertilizes an egg, the egg will implant on the wall of the uterus. This embryo will begin to develop into a baby. Genes from you and your partner are forming the baby. The female genes determine whether the baby is a boy or a girl. At 6-8 weeks, the eyes and face are formed, and the heartbeat can be seen on ultrasound. At the end of 12 weeks, all the baby's organs are formed.  Now that you are pregnant, you will want to do everything you can to have a healthy baby. Two  of the most important things are to get good prenatal care and to follow your health care provider's instructions. Prenatal care is all the medical care you receive before the baby's birth. This care will help prevent, find, and treat any problems during the pregnancy and childbirth. BODY CHANGES Your body goes through many changes during pregnancy. The changes vary from woman to woman.   You may gain or lose a couple of pounds at first.  You may feel sick to your stomach (nauseous) and throw up (vomit). If the vomiting is uncontrollable, call your health care provider.  You may tire easily.  You may develop headaches that can be relieved by medicines approved by your health care provider.  You may urinate more often. Painful urination may mean you have a bladder infection.  You may develop heartburn as a result of your pregnancy.  You may develop constipation because certain hormones are causing the muscles that push waste through your intestines to slow down.  You may develop hemorrhoids or swollen, bulging veins (varicose veins).  Your breasts may begin to grow larger and become tender. Your nipples may stick out more, and the tissue that surrounds them (areola) may become darker.  Your gums may bleed and may be sensitive to brushing and flossing.  Dark spots or blotches (chloasma, mask of pregnancy) may develop on your face. This will likely fade after the baby is born.  Your menstrual periods will stop.  You may have a loss of appetite.  You may develop cravings for certain kinds of food.  You may have changes in your emotions from day to day, such as being excited to be pregnant or being concerned that something may go wrong with the pregnancy and baby.  You may have more vivid and strange dreams.  You may have changes in your hair. These can include thickening of your hair, rapid growth, and changes in texture. Some women also have hair loss during or after pregnancy, or  hair that feels dry or thin. Your hair will most likely return to normal after your baby is born. WHAT TO EXPECT AT YOUR PRENATAL VISITS During a routine prenatal visit:  You will be weighed to make sure you and the baby are growing normally.  Your blood pressure will be taken.  Your abdomen will be measured to track your baby's growth.  The fetal heartbeat will be listened to starting around week 10 or 12 of your pregnancy.  Test results from any previous visits will be discussed. Your health care provider may ask you:  How you are feeling.  If you are feeling the baby move.  If you have had any abnormal symptoms, such as  leaking fluid, bleeding, severe headaches, or abdominal cramping.  If you have any questions. Other tests that may be performed during your first trimester include:  Blood tests to find your blood type and to check for the presence of any previous infections. They will also be used to check for low iron levels (anemia) and Rh antibodies. Later in the pregnancy, blood tests for diabetes will be done along with other tests if problems develop.  Urine tests to check for infections, diabetes, or protein in the urine.  An ultrasound to confirm the proper growth and development of the baby.  An amniocentesis to check for possible genetic problems.  Fetal screens for spina bifida and Down syndrome.  You may need other tests to make sure you and the baby are doing well. HOME CARE INSTRUCTIONS  Medicines  Follow your health care provider's instructions regarding medicine use. Specific medicines may be either safe or unsafe to take during pregnancy.  Take your prenatal vitamins as directed.  If you develop constipation, try taking a stool softener if your health care provider approves. Diet  Eat regular, well-balanced meals. Choose a variety of foods, such as meat or vegetable-based protein, fish, milk and low-fat dairy products, vegetables, fruits, and whole  grain breads and cereals. Your health care provider will help you determine the amount of weight gain that is right for you.  Avoid raw meat and uncooked cheese. These carry germs that can cause birth defects in the baby.  Eating four or five small meals rather than three large meals a day may help relieve nausea and vomiting. If you start to feel nauseous, eating a few soda crackers can be helpful. Drinking liquids between meals instead of during meals also seems to help nausea and vomiting.  If you develop constipation, eat more high-fiber foods, such as fresh vegetables or fruit and whole grains. Drink enough fluids to keep your urine clear or pale yellow. Activity and Exercise  Exercise only as directed by your health care provider. Exercising will help you:  Control your weight.  Stay in shape.  Be prepared for labor and delivery.  Experiencing pain or cramping in the lower abdomen or low back is a good sign that you should stop exercising. Check with your health care provider before continuing normal exercises.  Try to avoid standing for long periods of time. Move your legs often if you must stand in one place for a long time.  Avoid heavy lifting.  Wear low-heeled shoes, and practice good posture.  You may continue to have sex unless your health care provider directs you otherwise. Relief of Pain or Discomfort  Wear a good support bra for breast tenderness.    Take warm sitz baths to soothe any pain or discomfort caused by hemorrhoids. Use hemorrhoid cream if your health care provider approves.    Rest with your legs elevated if you have leg cramps or low back pain.  If you develop varicose veins in your legs, wear support hose. Elevate your feet for 15 minutes, 3-4 times a day. Limit salt in your diet. Prenatal Care  Schedule your prenatal visits by the twelfth week of pregnancy. They are usually scheduled monthly at first, then more often in the last 2 months before  delivery.  Write down your questions. Take them to your prenatal visits.  Keep all your prenatal visits as directed by your health care provider. Safety  Wear your seat belt at all times when driving.  Make a list  of emergency phone numbers, including numbers for family, friends, the hospital, and police and fire departments. General Tips  Ask your health care provider for a referral to a local prenatal education class. Begin classes no later than at the beginning of month 6 of your pregnancy.  Ask for help if you have counseling or nutritional needs during pregnancy. Your health care provider can offer advice or refer you to specialists for help with various needs.  Do not use hot tubs, steam rooms, or saunas.  Do not douche or use tampons or scented sanitary pads.  Do not cross your legs for long periods of time.  Avoid cat litter boxes and soil used by cats. These carry germs that can cause birth defects in the baby and possibly loss of the fetus by miscarriage or stillbirth.  Avoid all smoking, herbs, alcohol, and medicines not prescribed by your health care provider. Chemicals in these affect the formation and growth of the baby.  Schedule a dentist appointment. At home, brush your teeth with a soft toothbrush and be gentle when you floss. SEEK MEDICAL CARE IF:   You have dizziness.  You have mild pelvic cramps, pelvic pressure, or nagging pain in the abdominal area.  You have persistent nausea, vomiting, or diarrhea.  You have a bad smelling vaginal discharge.  You have pain with urination.  You notice increased swelling in your face, hands, legs, or ankles. SEEK IMMEDIATE MEDICAL CARE IF:   You have a fever.  You are leaking fluid from your vagina.  You have spotting or bleeding from your vagina.  You have severe abdominal cramping or pain.  You have rapid weight gain or loss.  You vomit blood or material that looks like coffee grounds.  You are exposed to  [place] measles and have never had them.  You are exposed to fifth disease or chickenpox.  You develop a severe headache.  You have shortness of breath.  You have any kind of trauma, such as from a fall or a car accident. Document Released: 01/21/2001 Document Revised: 06/13/2013 Document Reviewed: 12/07/2012 [facility] Patient Information 2015 [place], [place]. This information is not intended to replace advice given to you by your health care provider. Make sure you discuss any questions you have with your health care provider.

## 2020-04-26 LAB — PMP SCREEN PROFILE (10S), URINE
Amphetamine Scrn, Ur: NEGATIVE ng/mL
BARBITURATE SCREEN URINE: NEGATIVE ng/mL
BENZODIAZEPINE SCREEN, URINE: NEGATIVE ng/mL
CANNABINOIDS UR QL SCN: NEGATIVE ng/mL
Cocaine (Metab) Scrn, Ur: NEGATIVE ng/mL
Creatinine(Crt), U: 167.6 mg/dL (ref 20.0–300.0)
Methadone Screen, Urine: NEGATIVE ng/mL
OXYCODONE+OXYMORPHONE UR QL SCN: NEGATIVE ng/mL
Opiate Scrn, Ur: NEGATIVE ng/mL
Ph of Urine: 5.7 (ref 4.5–8.9)
Phencyclidine Qn, Ur: NEGATIVE ng/mL
Propoxyphene Scrn, Ur: NEGATIVE ng/mL

## 2020-04-26 LAB — CBC/D/PLT+RPR+RH+ABO+RUB AB...
Antibody Screen: NEGATIVE
Basophils Absolute: 0 10*3/uL (ref 0.0–0.2)
Basos: 0 %
EOS (ABSOLUTE): 0.1 10*3/uL (ref 0.0–0.4)
Eos: 1 %
HCV Ab: 0.2 s/co ratio (ref 0.0–0.9)
HIV Screen 4th Generation wRfx: NONREACTIVE
Hematocrit: 30.9 % — ABNORMAL LOW (ref 34.0–46.6)
Hemoglobin: 10.3 g/dL — ABNORMAL LOW (ref 11.1–15.9)
Hepatitis B Surface Ag: NEGATIVE
Immature Grans (Abs): 0 10*3/uL (ref 0.0–0.1)
Immature Granulocytes: 0 %
Lymphocytes Absolute: 2.1 10*3/uL (ref 0.7–3.1)
Lymphs: 31 %
MCH: 27.9 pg (ref 26.6–33.0)
MCHC: 33.3 g/dL (ref 31.5–35.7)
MCV: 84 fL (ref 79–97)
Monocytes Absolute: 0.5 10*3/uL (ref 0.1–0.9)
Monocytes: 8 %
Neutrophils Absolute: 4 10*3/uL (ref 1.4–7.0)
Neutrophils: 60 %
Platelets: 392 10*3/uL (ref 150–450)
RBC: 3.69 x10E6/uL — ABNORMAL LOW (ref 3.77–5.28)
RDW: 12.6 % (ref 11.7–15.4)
RPR Ser Ql: NONREACTIVE
Rh Factor: POSITIVE
Rubella Antibodies, IGG: <0.9 index — ABNORMAL LOW (ref >0.99)
WBC: 6.7 10*3/uL (ref 3.4–10.8)

## 2020-04-26 LAB — GC/CHLAMYDIA PROBE AMP
Chlamydia trachomatis, NAA: NEGATIVE
Neisseria Gonorrhoeae by PCR: NEGATIVE

## 2020-04-26 LAB — HCV INTERPRETATION

## 2020-04-28 LAB — URINE CULTURE

## 2020-04-30 ENCOUNTER — Encounter: Payer: Self-pay | Admitting: Advanced Practice Midwife

## 2020-04-30 ENCOUNTER — Other Ambulatory Visit: Payer: Self-pay | Admitting: Advanced Practice Midwife

## 2020-04-30 DIAGNOSIS — Z2839 Other underimmunization status: Secondary | ICD-10-CM | POA: Insufficient documentation

## 2020-04-30 DIAGNOSIS — O09899 Supervision of other high risk pregnancies, unspecified trimester: Secondary | ICD-10-CM | POA: Insufficient documentation

## 2020-04-30 DIAGNOSIS — O99891 Other specified diseases and conditions complicating pregnancy: Secondary | ICD-10-CM | POA: Insufficient documentation

## 2020-04-30 DIAGNOSIS — O99019 Anemia complicating pregnancy, unspecified trimester: Secondary | ICD-10-CM | POA: Insufficient documentation

## 2020-04-30 MED ORDER — BLOOD PRESSURE MONITOR MISC: 0 refills | Status: DC

## 2020-04-30 MED ORDER — FERROUS FUMARATE 324 (106 FE) MG PO TABS: 1.0000 | ORAL_TABLET | ORAL | 6 refills | Status: DC

## 2020-04-30 NOTE — Addendum Note (Signed)
Addended by: Moss Mc on: 04/30/2020 01:23 PM   Modules accepted: Orders

## 2020-05-09 ENCOUNTER — Telehealth: Payer: Self-pay | Admitting: Women's Health

## 2020-05-09 ENCOUNTER — Encounter: Payer: Self-pay | Admitting: Women's Health

## 2020-05-09 DIAGNOSIS — O285 Abnormal chromosomal and genetic finding on antenatal screening of mother: Secondary | ICD-10-CM | POA: Insufficient documentation

## 2020-05-09 NOTE — Telephone Encounter (Signed)
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  It is recommended that the father of the baby be tested to see if he is a carrier for this. If he wants to be tested, please let us know as soon as possible. You can read more about what this is on the report in your [employer] portal. You can also schedule a telephone visit with a genetic counselor who can explain everything and answer any questions by calling [phone] or visiting [email or web address]. Please let me know if you have any questions. Kim   Last read by Redmond Baseman at 2:35 PM on 05/09/2020.

## 2020-05-16 ENCOUNTER — Encounter: Payer: Self-pay | Admitting: Women's Health

## 2020-05-23 ENCOUNTER — Encounter: Payer: Self-pay | Admitting: Women's Health

## 2020-05-23 ENCOUNTER — Ambulatory Visit (INDEPENDENT_AMBULATORY_CARE_PROVIDER_SITE_OTHER): Payer: Managed Care, Other (non HMO)

## 2020-05-23 ENCOUNTER — Other Ambulatory Visit: Payer: Self-pay

## 2020-05-23 ENCOUNTER — Ambulatory Visit (INDEPENDENT_AMBULATORY_CARE_PROVIDER_SITE_OTHER): Payer: Managed Care, Other (non HMO) | Admitting: Women's Health

## 2020-05-23 VITALS — BP 125/73 | HR 94 | Wt 202.4 lb

## 2020-05-23 DIAGNOSIS — O99012 Anemia complicating pregnancy, second trimester: Secondary | ICD-10-CM

## 2020-05-23 DIAGNOSIS — Z363 Encounter for antenatal screening for malformations: Secondary | ICD-10-CM | POA: Diagnosis not present

## 2020-05-23 DIAGNOSIS — Z3402 Encounter for supervision of normal first pregnancy, second trimester: Secondary | ICD-10-CM

## 2020-05-23 DIAGNOSIS — Z362 Encounter for other antenatal screening follow-up: Secondary | ICD-10-CM

## 2020-05-23 DIAGNOSIS — O285 Abnormal chromosomal and genetic finding on antenatal screening of mother: Secondary | ICD-10-CM

## 2020-05-23 DIAGNOSIS — O09899 Supervision of other high risk pregnancies, unspecified trimester: Secondary | ICD-10-CM

## 2020-05-23 DIAGNOSIS — Z1379 Encounter for other screening for genetic and chromosomal anomalies: Secondary | ICD-10-CM

## 2020-05-23 DIAGNOSIS — Z2839 Other underimmunization status: Secondary | ICD-10-CM

## 2020-05-23 MED ORDER — ASPIRIN 81 MG PO TBEC: 81.0000 mg | DELAYED_RELEASE_TABLET | Freq: Every day | ORAL | 3 refills | Status: DC

## 2020-05-23 NOTE — Progress Notes (Signed)
LOW-RISK PREGNANCY VISIT Patient name: Alisha Norman MRN [id number]  Date of birth: 2000-06-25 Chief Complaint:   Routine Prenatal Visit and Pregnancy Ultrasound  History of Present Illness:   Alisha Norman is a 20 y.o. G80P0000 female at [redacted]w[redacted]d with an Estimated Date of Delivery: 10/16/20 being seen today for ongoing management of a low-risk pregnancy.  Depression screen [facility] 2/9 04/25/2020 10/25/2019 03/23/2017  Decreased Interest 0 0 0  Down, Depressed, Hopeless 0 0 0  PHQ - 2 Score 0 0 0  Altered sleeping 0 0 -  Tired, decreased energy 1 0 -  Change in appetite 0 0 -  Feeling bad or failure about yourself  0 0 -  Trouble concentrating 0 0 -  Moving slowly or fidgety/restless 0 0 -  Suicidal thoughts 0 0 -  PHQ-9 Score 1 0 -    Today she reports no complaints. Interested in Systems developer. Contractions: Not present. Vag. Bleeding: None.  Movement: Present. denies leaking of fluid. Review of Systems:   Pertinent items are noted in HPI Denies abnormal vaginal discharge w/ itching/odor/irritation, headaches, visual changes, shortness of breath, chest pain, abdominal pain, severe nausea/vomiting, or problems with urination or bowel movements unless otherwise stated above. Pertinent History Reviewed:  Reviewed past medical,surgical, social, obstetrical and family history.  Reviewed problem list, medications and allergies. Physical Assessment:   Vitals:   05/23/20 1428  BP: 125/73  Pulse: 94  Weight: 202 lb 6.4 oz (91.8 kg)  Body mass index is 30.77 kg/m.        Physical Examination:   General appearance: Well appearing, and in no distress  Mental status: Alert, oriented to person, place, and time  Skin: Warm & dry  Cardiovascular: Normal heart rate noted  Respiratory: Normal respiratory effort, no distress  Abdomen: Soft, gravid, nontender  Pelvic: Cervical exam deferred         Extremities: Edema: None  Fetal Status: Fetal Heart Rate (bpm): 146 u/s   Movement: Present    [place]  19+1 wks,breech,posterior placenta gr 0,cx length 3.1 cm,normal ovaries,svp of fluid 3.8 cm,fhr 146 bpm,EFW 321 g 86%,limited view of spine because of fetal position,please have pt come back for additional images,no obvious abnormalities   Chaperone: N/A   No results found for this or any previous visit (from the past 24 hour(s)).  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 aspirin 81 MG EC tablet    Sig: Take 1 tablet (81 mg total) by mouth daily. Swallow whole.    Dispense:  90 tablet    Refill:  3    Order Specific Question:   Supervising Provider    Answer:   Lazaro Arms [[id number]]   Labs/procedures today: U/S and AFP-OSB  Plan:  Continue routine obstetrical care  Next visit: prefers will be in person for u/s    Reviewed: Preterm labor symptoms and general obstetric precautions including but not limited to vaginal bleeding, contractions, leaking of fluid and fetal movement were reviewed in detail with the patient.  All questions were answered.   Follow-up: Return in about 4 weeks (around 06/20/2020) for LROB, US:OB F/U spine, CNM, in person.  Future Appointments  Date Time Provider Department Center  06/20/2020  3:30 PM [facility] [place] [facility]  None  06/20/2020  4:10 PM Javae Braaten, Merlene Laughter, CNM [facility]    Orders Placed This Encounter  Procedures  . US OB Follow Up  . AFP, Serum, Open Spina Bifida   Cheral Marker CNM, [facility] 05/23/2020 3:01 PM

## 2020-05-23 NOTE — Progress Notes (Signed)
[place] 19+1 wks,breech,posterior placenta gr 0,cx length 3.1 cm,normal ovaries,svp of fluid 3.8 cm,fhr 146 bpm,EFW 321 g 86%,limited view of spine because of fetal position,please have pt come back for additional images,no obvious abnormalities

## 2020-05-23 NOTE — Patient Instructions (Signed)
Alisha Norman, I greatly value your feedback.  If you receive a survey following your visit with [place] today, we appreciate you taking the time to fill it out.  Thanks, Joellyn Haff, CNM, WHNP-BC  [facility] ([address] [place], [place] [address]) Entrance C, located off of [address] valet parking  Go to [email or web address] to register for FREE online childbirth classes  [place] Pediatricians/Family Doctors:  [facility] [phone]            [facility] [phone]                 [facility] [phone] (usually not accepting new patients unless you have family there already, you are always welcome to call and ask)       [facility] Department [phone]       [facility] Pediatricians/Family Doctors:   [facility]: [phone]  [facility]: [phone]  [facility]: [phone]  [facility] Doctors:   [facility]: [phone]   [facility]: [phone]  [facility] Doctors:  [facility]: [phone]    Home Blood Pressure Monitoring for Patients   Your provider has recommended that you check your blood pressure (BP) at least once a week at home. If you do not have a blood pressure cuff at home, one will be provided for you. Contact your provider if you have not received your monitor within 1 week.   Helpful Tips for Accurate Home Blood Pressure Checks  . Don't smoke, exercise, or drink caffeine 30 minutes before checking your BP . Use the restroom before checking your BP (a full bladder can raise your pressure) . Relax in a comfortable upright chair . Feet on the ground . Left arm resting comfortably on a flat surface at the level of your heart . Legs uncrossed . Back supported . Sit quietly and don't talk . Place the cuff on your bare arm . Adjust snuggly, so  that only two fingertips can fit between your skin and the top of the cuff . Check 2 readings separated by at least one minute . Keep a log of your BP readings . For a visual, please reference this diagram: http://ccnc.care/bpdiagram  Provider Name: [facility]     Phone: [phone]  Zone 1: ALL CLEAR  Continue to monitor your symptoms:  . BP reading is less than 140 (top number) or less than 90 (bottom number)  . No right upper stomach pain . No headaches or seeing spots . No feeling nauseated or throwing up . No swelling in face and hands  Zone 2: CAUTION Call your doctor's office for any of the following:  . BP reading is greater than 140 (top number) or greater than 90 (bottom number)  . Stomach pain under your ribs in the middle or right side . Headaches or seeing spots . Feeling nauseated or throwing up . Swelling in face and hands  Zone 3: EMERGENCY  Seek immediate medical care if you have any of the following:  . BP reading is greater than160 (top number) or greater than 110 (bottom number) . Severe headaches not improving with Tylenol . Serious difficulty catching your breath . Any worsening symptoms from Zone 2     Second Trimester of Pregnancy The second trimester is from week 14 through week 27 (months 4 through 6). The second trimester is often a time when you feel your  best. Your body has adjusted to being pregnant, and you begin to feel better physically. Usually, morning sickness has lessened or quit completely, you may have more energy, and you may have an increase in appetite. The second trimester is also a time when the fetus is growing rapidly. At the end of the sixth month, the fetus is about 9 inches long and weighs about 1 pounds. You will likely begin to feel the baby move (quickening) between 16 and 20 weeks of pregnancy. Body changes during your second trimester Your body continues to go through many changes during your second trimester. The  changes vary from woman to woman.  Your weight will continue to increase. You will notice your lower abdomen bulging out.  You may begin to get stretch marks on your hips, abdomen, and breasts.  You may develop headaches that can be relieved by medicines. The medicines should be approved by your health care provider.  You may urinate more often because the fetus is pressing on your bladder.  You may develop or continue to have heartburn as a result of your pregnancy.  You may develop constipation because certain hormones are causing the muscles that push waste through your intestines to slow down.  You may develop hemorrhoids or swollen, bulging veins (varicose veins).  You may have back pain. This is caused by: ? Weight gain. ? Pregnancy hormones that are relaxing the joints in your pelvis. ? A shift in weight and the muscles that support your balance.  Your breasts will continue to grow and they will continue to become tender.  Your gums may bleed and may be sensitive to brushing and flossing.  Dark spots or blotches (chloasma, mask of pregnancy) may develop on your face. This will likely fade after the baby is born.  A dark line from your belly button to the pubic area (linea nigra) may appear. This will likely fade after the baby is born.  You may have changes in your hair. These can include thickening of your hair, rapid growth, and changes in texture. Some women also have hair loss during or after pregnancy, or hair that feels dry or thin. Your hair will most likely return to normal after your baby is born.  What to expect at prenatal visits During a routine prenatal visit:  You will be weighed to make sure you and the fetus are growing normally.  Your blood pressure will be taken.  Your abdomen will be measured to track your baby's growth.  The fetal heartbeat will be listened to.  Any test results from the previous visit will be discussed.  Your health care  provider may ask you:  How you are feeling.  If you are feeling the baby move.  If you have had any abnormal symptoms, such as leaking fluid, bleeding, severe headaches, or abdominal cramping.  If you are using any tobacco products, including cigarettes, chewing tobacco, and electronic cigarettes.  If you have any questions.  Other tests that may be performed during your second trimester include:  Blood tests that check for: ? Low iron levels (anemia). ? High blood sugar that affects pregnant women (gestational diabetes) between 71 and 28 weeks. ? Rh antibodies. This is to check for a protein on red blood cells (Rh factor).  Urine tests to check for infections, diabetes, or protein in the urine.  An ultrasound to confirm the proper growth and development of the baby.  An amniocentesis to check for possible genetic problems.  Fetal  screens for spina bifida and Down syndrome.  HIV (human immunodeficiency virus) testing. Routine prenatal testing includes screening for HIV, unless you choose not to have this test.  Follow these instructions at home: Medicines  Follow your health care provider's instructions regarding medicine use. Specific medicines may be either safe or unsafe to take during pregnancy.  Take a prenatal vitamin that contains at least 600 micrograms (mcg) of folic acid.  If you develop constipation, try taking a stool softener if your health care provider approves. Eating and drinking  Eat a balanced diet that includes fresh fruits and vegetables, whole grains, good sources of protein such as meat, eggs, or tofu, and low-fat dairy. Your health care provider will help you determine the amount of weight gain that is right for you.  Avoid raw meat and uncooked cheese. These carry germs that can cause birth defects in the baby.  If you have low calcium intake from food, talk to your health care provider about whether you should take a daily calcium  supplement.  Limit foods that are high in fat and processed sugars, such as fried and sweet foods.  To prevent constipation: ? Drink enough fluid to keep your urine clear or pale yellow. ? Eat foods that are high in fiber, such as fresh fruits and vegetables, whole grains, and beans. Activity  Exercise only as directed by your health care provider. Most women can continue their usual exercise routine during pregnancy. Try to exercise for 30 minutes at least 5 days a week. Stop exercising if you experience uterine contractions.  Avoid heavy lifting, wear low heel shoes, and practice good posture.  A sexual relationship may be continued unless your health care provider directs you otherwise. Relieving pain and discomfort  Wear a good support bra to prevent discomfort from breast tenderness.  Take warm sitz baths to soothe any pain or discomfort caused by hemorrhoids. Use hemorrhoid cream if your health care provider approves.  Rest with your legs elevated if you have leg cramps or low back pain.  If you develop varicose veins, wear support hose. Elevate your feet for 15 minutes, 3-4 times a day. Limit salt in your diet. Prenatal Care  Write down your questions. Take them to your prenatal visits.  Keep all your prenatal visits as told by your health care provider. This is important. Safety  Wear your seat belt at all times when driving.  Make a list of emergency phone numbers, including numbers for family, friends, the hospital, and police and fire departments. General instructions  Ask your health care provider for a referral to a local prenatal education class. Begin classes no later than the beginning of month 6 of your pregnancy.  Ask for help if you have counseling or nutritional needs during pregnancy. Your health care provider can offer advice or refer you to specialists for help with various needs.  Do not use hot tubs, steam rooms, or saunas.  Do not douche or use  tampons or scented sanitary pads.  Do not cross your legs for long periods of time.  Avoid cat litter boxes and soil used by cats. These carry germs that can cause birth defects in the baby and possibly loss of the fetus by miscarriage or stillbirth.  Avoid all smoking, herbs, alcohol, and unprescribed drugs. Chemicals in these products can affect the formation and growth of the baby.  Do not use any products that contain nicotine or tobacco, such as cigarettes and e-cigarettes. If you need help  quitting, ask your health care provider.  Visit your dentist if you have not gone yet during your pregnancy. Use a soft toothbrush to brush your teeth and be gentle when you floss. Contact a health care provider if:  You have dizziness.  You have mild pelvic cramps, pelvic pressure, or nagging pain in the abdominal area.  You have persistent nausea, vomiting, or diarrhea.  You have a bad smelling vaginal discharge.  You have pain when you urinate. Get help right away if:  You have a fever.  You are leaking fluid from your vagina.  You have spotting or bleeding from your vagina.  You have severe abdominal cramping or pain.  You have rapid weight gain or weight loss.  You have shortness of breath with chest pain.  You notice sudden or extreme swelling of your face, hands, ankles, feet, or legs.  You have not felt your baby move in over an hour.  You have severe headaches that do not go away when you take medicine.  You have vision changes. Summary  The second trimester is from week 14 through week 27 (months 4 through 6). It is also a time when the fetus is growing rapidly.  Your body goes through many changes during pregnancy. The changes vary from woman to woman.  Avoid all smoking, herbs, alcohol, and unprescribed drugs. These chemicals affect the formation and growth your baby.  Do not use any tobacco products, such as cigarettes, chewing tobacco, and e-cigarettes. If you  need help quitting, ask your health care provider.  Contact your health care provider if you have any questions. Keep all prenatal visits as told by your health care provider. This is important. This information is not intended to replace advice given to you by your health care provider. Make sure you discuss any questions you have with your health care provider. Document Released: 01/21/2001 Document Revised: 07/05/2015 Document Reviewed: 03/30/2012 Elsevier Interactive Patient Education  2017 [employer]   

## 2020-05-25 LAB — AFP, SERUM, OPEN SPINA BIFIDA
AFP MoM: 1.12
AFP Value: 54.3 ng/mL
Gest. Age on Collection Date: 19.1 weeks
Maternal Age At EDD: 22.6 yr
OSBR Risk 1 IN: 10000
Test Results:: NEGATIVE
Weight: 202 [lb_av]

## 2020-06-20 ENCOUNTER — Ambulatory Visit (INDEPENDENT_AMBULATORY_CARE_PROVIDER_SITE_OTHER): Payer: Managed Care, Other (non HMO) | Admitting: Women's Health

## 2020-06-20 ENCOUNTER — Other Ambulatory Visit: Payer: Self-pay

## 2020-06-20 ENCOUNTER — Encounter: Payer: Self-pay | Admitting: Women's Health

## 2020-06-20 ENCOUNTER — Ambulatory Visit (INDEPENDENT_AMBULATORY_CARE_PROVIDER_SITE_OTHER): Payer: Managed Care, Other (non HMO)

## 2020-06-20 VITALS — BP 125/65 | HR 96 | Wt 208.0 lb

## 2020-06-20 DIAGNOSIS — Z3A23 23 weeks gestation of pregnancy: Secondary | ICD-10-CM

## 2020-06-20 DIAGNOSIS — Z362 Encounter for other antenatal screening follow-up: Secondary | ICD-10-CM | POA: Diagnosis not present

## 2020-06-20 DIAGNOSIS — Z2839 Other underimmunization status: Secondary | ICD-10-CM

## 2020-06-20 DIAGNOSIS — O99012 Anemia complicating pregnancy, second trimester: Secondary | ICD-10-CM

## 2020-06-20 DIAGNOSIS — Z3402 Encounter for supervision of normal first pregnancy, second trimester: Secondary | ICD-10-CM

## 2020-06-20 DIAGNOSIS — O09899 Supervision of other high risk pregnancies, unspecified trimester: Secondary | ICD-10-CM

## 2020-06-20 NOTE — Progress Notes (Signed)
[place] 23+1 wks,cephalic,cx 3.4 cm,posterior placenta gr 0,svp of fluid 3.9 cm,normal ovaries,EFW 566 g 41%,anatomy of the spine complete,no obvious abnormalities

## 2020-06-20 NOTE — Progress Notes (Signed)
   LOW-RISK PREGNANCY VISIT Patient name: Alisha Norman MRN [id number]  Date of birth: Mar 07, 2000 Chief Complaint:   Routine Prenatal Visit  History of Present Illness:   Alisha Norman is a 20 y.o. G60P0000 female at [redacted]w[redacted]d with an Estimated Date of Delivery: 10/16/20 being seen today for ongoing management of a low-risk pregnancy.  Depression screen [facility] 2/9 04/25/2020 10/25/2019 03/23/2017  Decreased Interest 0 0 0  Down, Depressed, Hopeless 0 0 0  PHQ - 2 Score 0 0 0  Altered sleeping 0 0 -  Tired, decreased energy 1 0 -  Change in appetite 0 0 -  Feeling bad or failure about yourself  0 0 -  Trouble concentrating 0 0 -  Moving slowly or fidgety/restless 0 0 -  Suicidal thoughts 0 0 -  PHQ-9 Score 1 0 -    Today she reports no complaints. Contractions: Not present. Vag. Bleeding: None.  Movement: Present. denies leaking of fluid. Review of Systems:   Pertinent items are noted in HPI Denies abnormal vaginal discharge w/ itching/odor/irritation, headaches, visual changes, shortness of breath, chest pain, abdominal pain, severe nausea/vomiting, or problems with urination or bowel movements unless otherwise stated above. Pertinent History Reviewed:  Reviewed past medical,surgical, social, obstetrical and family history.  Reviewed problem list, medications and allergies. Physical Assessment:   Vitals:   06/20/20 1622  BP: 125/65  Pulse: 96  Weight: 208 lb (94.3 kg)  Body mass index is 31.63 kg/m.        Physical Examination:   General appearance: Well appearing, and in no distress  Mental status: Alert, oriented to person, place, and time  Skin: Warm & dry  Cardiovascular: Normal heart rate noted  Respiratory: Normal respiratory effort, no distress  Abdomen: Soft, gravid, nontender  Pelvic: Cervical exam deferred         Extremities: Edema: None  Fetal Status: Fetal Heart Rate (bpm): 146 u/s   Movement: Present   [place] 23+1 wks,cephalic,cx 3.4 cm,posterior placenta gr 0,svp of  fluid 3.9 cm,normal ovaries,EFW 566 g 41%,anatomy of the spine complete,no obvious abnormalities   Chaperone: N/A   No results found for this or any previous visit (from the past 24 hour(s)).  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  Labs/procedures today: none  Plan:  Continue routine obstetrical care  Next visit: prefers will be in person for pn2    Reviewed: Preterm labor symptoms and general obstetric precautions including but not limited to vaginal bleeding, contractions, leaking of fluid and fetal movement were reviewed in detail with the patient.  All questions were answered.   Follow-up: Return in about 4 weeks (around 07/18/2020) for LROB, PN2, in person, CNM.  No future appointments.  No orders of the defined types were placed in this encounter.  Cheral Marker CNM, [facility] 06/20/2020 4:48 PM

## 2020-06-20 NOTE — Patient Instructions (Addendum)
Alisha Norman, I greatly value your feedback.  If you receive a survey following your visit with [place] today, we appreciate you taking the time to fill it out.  Thanks, Joellyn Haff, CNM, WHNP-BC   You will have your sugar test next visit.  Please do not eat or drink anything after midnight the night before you come, not even water.  You will be here for at least two hours.  Please make an appointment online for the bloodwork at [email or web address] for 8:30am (or as close to this as possible). Make sure you select the [facility] service center. The day of the appointment, check in with our office first, then you will go to [facility] to start the sugar test.    [facility] [place], [place] [address]) Entrance C, located off of [address] valet parking  Go to [email or web address] to register for FREE online childbirth classes   Call the office [phone]) or go to [facility] if:  You begin to have strong, frequent contractions  Your water breaks.  Sometimes it is a big gush of fluid, sometimes it is just a trickle that keeps getting your panties wet or running down your legs  You have vaginal bleeding.  It is normal to have a small amount of spotting if your cervix was checked.   You don't feel your baby moving like normal.  If you don't, get you something to eat and drink and lay down and focus on feeling your baby move.   If your baby is still not moving like normal, you should call the office or go to [facility].  [place] Pediatricians/Family Doctors:  [facility] [phone]            [facility] [phone]                 [facility] [phone] (usually not accepting new patients unless you have family there already, you are always welcome to call and ask)       [facility] Department [phone]       [facility] Pediatricians/Family Doctors:   [facility]:  [phone]  [facility]: [phone]  [facility]: [phone]  [facility] Doctors:   [facility]: [phone]   [facility]: [phone]  [facility] Doctors:  [facility]: [phone]   Home Blood Pressure Monitoring for Patients   Your provider has recommended that you check your blood pressure (BP) at least once a week at home. If you do not have a blood pressure cuff at home, one will be provided for you. Contact your provider if you have not received your monitor within 1 week.   Helpful Tips for Accurate Home Blood Pressure Checks  . Don't smoke, exercise, or drink caffeine 30 minutes before checking your BP . Use the restroom before checking your BP (a full bladder can raise your pressure) . Relax in a comfortable upright chair . Feet on the ground . Left arm resting comfortably on a flat surface at the level of your heart . Legs uncrossed . Back supported . Sit quietly and don't talk . Place the cuff on your bare arm . Adjust snuggly, so that only two fingertips can fit between your skin and the top of the cuff . Check 2 readings separated by at least one minute . Keep a log of your BP readings . For a visual, please  reference this diagram: http://ccnc.care/bpdiagram  Provider Name: [facility]     Phone: [phone]  Zone 1: ALL CLEAR  Continue to monitor your symptoms:  . BP reading is less than 140 (top number) or less than 90 (bottom number)  . No right upper stomach pain . No headaches or seeing spots . No feeling nauseated or throwing up . No swelling in face and hands  Zone 2: CAUTION Call your doctor's office for any of the following:  . BP reading is greater than 140 (top number) or greater than 90 (bottom number)  . Stomach pain under your ribs in the middle or right side . Headaches or seeing spots . Feeling nauseated or throwing up . Swelling in  face and hands  Zone 3: EMERGENCY  Seek immediate medical care if you have any of the following:  . BP reading is greater than160 (top number) or greater than 110 (bottom number) . Severe headaches not improving with Tylenol . Serious difficulty catching your breath . Any worsening symptoms from Zone 2   Second Trimester of Pregnancy The second trimester is from week 13 through week 28, months 4 through 6. The second trimester is often a time when you feel your best. Your body has also adjusted to being pregnant, and you begin to feel better physically. Usually, morning sickness has lessened or quit completely, you may have more energy, and you may have an increase in appetite. The second trimester is also a time when the fetus is growing rapidly. At the end of the sixth month, the fetus is about 9 inches long and weighs about 1 pounds. You will likely begin to feel the baby move (quickening) between 18 and 20 weeks of the pregnancy. BODY CHANGES Your body goes through many changes during pregnancy. The changes vary from woman to woman.   Your weight will continue to increase. You will notice your lower abdomen bulging out.  You may begin to get stretch marks on your hips, abdomen, and breasts.  You may develop headaches that can be relieved by medicines approved by your health care provider.  You may urinate more often because the fetus is pressing on your bladder.  You may develop or continue to have heartburn as a result of your pregnancy.  You may develop constipation because certain hormones are causing the muscles that push waste through your intestines to slow down.  You may develop hemorrhoids or swollen, bulging veins (varicose veins).  You may have back pain because of the weight gain and pregnancy hormones relaxing your joints between the bones in your pelvis and as a result of a shift in weight and the muscles that support your balance.  Your breasts will continue to grow  and be tender.  Your gums may bleed and may be sensitive to brushing and flossing.  Dark spots or blotches (chloasma, mask of pregnancy) may develop on your face. This will likely fade after the baby is born.  A dark line from your belly button to the pubic area (linea nigra) may appear. This will likely fade after the baby is born.  You may have changes in your hair. These can include thickening of your hair, rapid growth, and changes in texture. Some women also have hair loss during or after pregnancy, or hair that feels dry or thin. Your hair will most likely return to normal after your baby is born. WHAT TO EXPECT AT YOUR PRENATAL VISITS During a routine prenatal visit:  You  will be weighed to make sure you and the fetus are growing normally.  Your blood pressure will be taken.  Your abdomen will be measured to track your baby's growth.  The fetal heartbeat will be listened to.  Any test results from the previous visit will be discussed. Your health care provider may ask you:  How you are feeling.  If you are feeling the baby move.  If you have had any abnormal symptoms, such as leaking fluid, bleeding, severe headaches, or abdominal cramping.  If you have any questions. Other tests that may be performed during your second trimester include:  Blood tests that check for:  Low iron levels (anemia).  Gestational diabetes (between 24 and 28 weeks).  Rh antibodies.  Urine tests to check for infections, diabetes, or protein in the urine.  An ultrasound to confirm the proper growth and development of the baby.  An amniocentesis to check for possible genetic problems.  Fetal screens for spina bifida and Down syndrome. HOME CARE INSTRUCTIONS   Avoid all smoking, herbs, alcohol, and unprescribed drugs. These chemicals affect the formation and growth of the baby.  Follow your health care provider's instructions regarding medicine use. There are medicines that are either  safe or unsafe to take during pregnancy.  Exercise only as directed by your health care provider. Experiencing uterine cramps is a good sign to stop exercising.  Continue to eat regular, healthy meals.  Wear a good support bra for breast tenderness.  Do not use hot tubs, steam rooms, or saunas.  Wear your seat belt at all times when driving.  Avoid raw meat, uncooked cheese, cat litter boxes, and soil used by cats. These carry germs that can cause birth defects in the baby.  Take your prenatal vitamins.  Try taking a stool softener (if your health care provider approves) if you develop constipation. Eat more high-fiber foods, such as fresh vegetables or fruit and whole grains. Drink plenty of fluids to keep your urine clear or pale yellow.  Take warm sitz baths to soothe any pain or discomfort caused by hemorrhoids. Use hemorrhoid cream if your health care provider approves.  If you develop varicose veins, wear support hose. Elevate your feet for 15 minutes, 3-4 times a day. Limit salt in your diet.  Avoid heavy lifting, wear low heel shoes, and practice good posture.  Rest with your legs elevated if you have leg cramps or low back pain.  Visit your dentist if you have not gone yet during your pregnancy. Use a soft toothbrush to brush your teeth and be gentle when you floss.  A sexual relationship may be continued unless your health care provider directs you otherwise.  Continue to go to all your prenatal visits as directed by your health care provider. SEEK MEDICAL CARE IF:   You have dizziness.  You have mild pelvic cramps, pelvic pressure, or nagging pain in the abdominal area.  You have persistent nausea, vomiting, or diarrhea.  You have a bad smelling vaginal discharge.  You have pain with urination. SEEK IMMEDIATE MEDICAL CARE IF:   You have a fever.  You are leaking fluid from your vagina.  You have spotting or bleeding from your vagina.  You have severe  abdominal cramping or pain.  You have rapid weight gain or loss.  You have shortness of breath with chest pain.  You notice sudden or extreme swelling of your face, hands, ankles, feet, or legs.  You have not felt your baby move  in over an hour.  You have severe headaches that do not go away with medicine.  You have vision changes. Document Released: 01/21/2001 Document Revised: 02/01/2013 Document Reviewed: 03/30/2012 [facility] Patient Information 2015 [place], [place]. This information is not intended to replace advice given to you by your health care provider. Make sure you discuss any questions you have with your health care provider.   Considering Waterbirth? Guide for patients at [facility]) Why consider waterbirth? . Gentle birth for babies  . Less pain medicine used in labor  . May allow for passive descent/less pushing  . May reduce perineal tears  . More mobility and instinctive maternal position changes  . Increased maternal relaxation   Is waterbirth safe? What are the risks of infection, drowning or other complications? . Infection:  Marland Kitchen Very low risk (3.7 % for tub vs 4.8% for bed)  . 7 in 8000 waterbirths with documented infection  . Poorly cleaned equipment most common cause  . Slightly lower group B strep transmission rate  . Drowning  . Maternal:  . Very low risk  . Related to seizures or fainting  . Newborn:  Marland Kitchen Very low risk. No evidence of increased risk of respiratory problems in multiple large studies  . Physiological protection from breathing under water  . Avoid underwater birth if there are any fetal complications  . Once baby's head is out of the water, keep it out.  . Birth complication  . Some reports of cord trauma, but risk decreased by bringing baby to surface gradually  . No evidence of increased risk of shoulder dystocia. Mothers can usually change positions faster in water than in a bed, possibly aiding the maneuvers to  free the shoulder.   There are 2 things you MUST do to have a waterbirth with [facility]: 1. Attend a waterbirth class at [facility]   a. 3rd Wednesday of every month from 7-9 pm (virtual during COVID) b. Free c. Register online at [email or web address] or [email or web address] or by calling [phone] d. Bring [place] the certificate from the class to your prenatal appointment or send via MyChart 2. Meet with a midwife at 36 weeks* to see if you can still plan a waterbirth and to sign the consent.   *We also recommend that you schedule as many of your prenatal visits with a midwife as possible.    Helpful information: . You may want to bring a bathing suit top to the hospital to wear during labor but this is optional.  All other supplies are provided by the hospital. . Please arrive at the hospital with signs of active labor, and do not wait at home until late in labor. It takes 45 min- 2 hours for COVID testing, fetal monitoring, and check in to your room to take place, plus transport and filling of the waterbirth tub.    Things that would prevent you from having a waterbirth: . Unknown or Positive COVID-19 diagnosis upon admission to hospital* . Premature, <37wks  . Previous cesarean birth  . Presence of thick meconium-stained fluid  . Multiple gestation (Twins, triplets, etc.)  . Uncontrolled diabetes or gestational diabetes requiring medication  . Hypertension diagnosed in pregnancy or preexisting hypertension (gestational hypertension, preeclampsia, or chronic hypertension) . Fetal growth restriction (your baby measures less than 10th percentile on ultrasound) . Heavy vaginal bleeding  . Non-reassuring fetal heart rate  . Active infection (MRSA, etc.). Group B Strep is NOT a contraindication for  waterbirth.  . If your labor has to be induced and induction method requires continuous monitoring of the baby's heart rate  . Other risks/issues identified by  your obstetrical provider   Please remember that birth is unpredictable. Under certain unforeseeable circumstances your provider may advise against giving birth in the tub. These decisions will be made on a case-by-case basis and with the safety of you and your baby as our highest priority.   *Please remember that in order to have a waterbirth, you must test Negative to COVID-19 upon admission to the hospital.  Updated 05/21/20

## 2020-06-21 ENCOUNTER — Encounter: Payer: Self-pay | Admitting: Women's Health

## 2020-07-17 ENCOUNTER — Other Ambulatory Visit: Payer: Managed Care, Other (non HMO)

## 2020-07-17 DIAGNOSIS — Z131 Encounter for screening for diabetes mellitus: Secondary | ICD-10-CM

## 2020-07-17 DIAGNOSIS — O99012 Anemia complicating pregnancy, second trimester: Secondary | ICD-10-CM

## 2020-07-17 DIAGNOSIS — Z3A26 26 weeks gestation of pregnancy: Secondary | ICD-10-CM

## 2020-07-17 DIAGNOSIS — Z3402 Encounter for supervision of normal first pregnancy, second trimester: Secondary | ICD-10-CM

## 2020-07-18 ENCOUNTER — Other Ambulatory Visit: Payer: Self-pay | Admitting: Women's Health

## 2020-07-18 LAB — CBC
Hematocrit: 30.3 % — ABNORMAL LOW (ref 34.0–46.6)
Hemoglobin: 10 g/dL — ABNORMAL LOW (ref 11.1–15.9)
MCH: 27.6 pg (ref 26.6–33.0)
MCHC: 33 g/dL (ref 31.5–35.7)
MCV: 84 fL (ref 79–97)
Platelets: 344 10*3/uL (ref 150–450)
RBC: 3.62 x10E6/uL — ABNORMAL LOW (ref 3.77–5.28)
RDW: 11.7 % (ref 11.7–15.4)
WBC: 6.1 10*3/uL (ref 3.4–10.8)

## 2020-07-18 LAB — GLUCOSE TOLERANCE, 2 HOURS W/ 1HR
Glucose, 1 hour: 99 mg/dL (ref 65–179)
Glucose, 2 hour: 97 mg/dL (ref 65–152)
Glucose, Fasting: 83 mg/dL (ref 65–91)

## 2020-07-18 LAB — HIV ANTIBODY (ROUTINE TESTING W REFLEX): HIV Screen 4th Generation wRfx: NONREACTIVE

## 2020-07-18 LAB — RPR: RPR Ser Ql: NONREACTIVE

## 2020-07-18 LAB — ANTIBODY SCREEN: Antibody Screen: NEGATIVE

## 2020-07-19 ENCOUNTER — Encounter: Payer: Managed Care, Other (non HMO) | Admitting: Women's Health

## 2020-07-19 ENCOUNTER — Other Ambulatory Visit: Payer: Managed Care, Other (non HMO)

## 2020-07-20 ENCOUNTER — Encounter: Payer: Self-pay | Admitting: *Deleted

## 2020-07-25 ENCOUNTER — Encounter: Payer: Managed Care, Other (non HMO) | Admitting: Advanced Practice Midwife

## 2020-07-31 ENCOUNTER — Other Ambulatory Visit: Payer: Self-pay

## 2020-07-31 ENCOUNTER — Ambulatory Visit (INDEPENDENT_AMBULATORY_CARE_PROVIDER_SITE_OTHER): Payer: Managed Care, Other (non HMO) | Admitting: Women's Health

## 2020-07-31 ENCOUNTER — Encounter: Payer: Self-pay | Admitting: Women's Health

## 2020-07-31 VITALS — BP 122/70 | HR 99 | Wt 219.3 lb

## 2020-07-31 DIAGNOSIS — Z3403 Encounter for supervision of normal first pregnancy, third trimester: Secondary | ICD-10-CM

## 2020-07-31 DIAGNOSIS — O99013 Anemia complicating pregnancy, third trimester: Secondary | ICD-10-CM

## 2020-07-31 DIAGNOSIS — Z23 Encounter for immunization: Secondary | ICD-10-CM

## 2020-07-31 DIAGNOSIS — Z3A29 29 weeks gestation of pregnancy: Secondary | ICD-10-CM | POA: Diagnosis not present

## 2020-07-31 LAB — POCT HEMOGLOBIN: Hemoglobin: 9.8 g/dL — AB (ref 11–14.6)

## 2020-07-31 MED ORDER — FERROUS SULFATE 325 (65 FE) MG PO TABS: 325.0000 mg | ORAL_TABLET | ORAL | 2 refills | Status: DC

## 2020-07-31 NOTE — Patient Instructions (Signed)
Alisha Norman, thank you for choosing our office today! We appreciate the opportunity to meet your healthcare needs. You may receive a short survey by mail, e-mail, or through MyChart. If you are happy with your care we would appreciate if you could take just a few minutes to complete the survey questions. We read all of your comments and take your feedback very seriously. Thank you again for choosing our office.  [facility] ([address] [place], [place] [address]) Entrance C, located off of [address] Free 24/7 valet parking   CLASSES: Go to [email or web address] to register for classes (childbirth, breastfeeding, waterbirth, infant CPR, daddy bootcamp, etc.)  Call the office ([phone]) or go to [facility] if: You begin to have strong, frequent contractions Your water breaks.  Sometimes it is a big gush of fluid, sometimes it is just a trickle that keeps getting your panties wet or running down your legs You have vaginal bleeding.  It is normal to have a small amount of spotting if your cervix was checked.  You don't feel your baby moving like normal.  If you don't, get you something to eat and drink and lay down and focus on feeling your baby move.   If your baby is still not moving like normal, you should call the office or go to [facility].  Call the office ([phone]) or go to [facility] for these signs of pre-eclampsia: Severe headache that does not go away with Tylenol Visual changes- seeing spots, double, blurred vision Pain under your right breast or upper abdomen that does not go away with Tums or heartburn medicine Nausea and/or vomiting Severe swelling in your hands, feet, and face   Tdap Vaccine It is recommended that you get the Tdap vaccine during the third trimester of EACH pregnancy to help protect your baby from getting pertussis (whooping cough) 27-36 weeks is the BEST time to do  this so that you can pass the protection on to your baby. During pregnancy is better than after pregnancy, but if you are unable to get it during pregnancy it will be offered at the hospital.  You can get this vaccine with us, at the health department, your family doctor, or some local pharmacies Everyone who will be around your baby should also be up-to-date on their vaccines before the baby comes. Adults (who are not pregnant) only need 1 dose of Tdap during adulthood.   [place] Pediatricians/Family Doctors [facility] ([facility]): [address], [phone]           [facility]: [address], [phone]                [facility] ([facility]): [address], [phone] (call to ask if accepting patients) [facility]: [address], [place], [phone]    [place] Pediatricians/Family Doctors [facility] ([facility]): [address], [phone] [facility]: [address], [phone] [facility]: [address], [phone]  [place] [facility]  [facility] ([facility]): [phone] [facility]: [address], [phone]   [place] [facility]: [address], [phone]  [place] [facility]  [facility]: [address], [phone]  Home Blood Pressure Monitoring for Patients   Your provider has recommended that you check your   blood pressure (BP) at least once a week at home. If you do not have a blood pressure cuff at home, one will be provided for you. Contact your provider if you have not received your monitor within 1 week.   Helpful Tips for Accurate Home Blood Pressure Checks  Don't smoke, exercise, or drink caffeine 30 minutes before checking your BP Use the restroom before checking your BP (a full bladder can raise your  pressure) Relax in a comfortable upright chair Feet on the ground Left arm resting comfortably on a flat surface at the level of your heart Legs uncrossed Back supported Sit quietly and don't talk Place the cuff on your bare arm Adjust snuggly, so that only two fingertips can fit between your skin and the top of the cuff Check 2 readings separated by at least one minute Keep a log of your BP readings For a visual, please reference this diagram: http://ccnc.care/bpdiagram  Provider Name: [facility]     Phone: [phone]  Zone 1: ALL CLEAR  Continue to monitor your symptoms:  BP reading is less than 140 (top number) or less than 90 (bottom number)  No right upper stomach pain No headaches or seeing spots No feeling nauseated or throwing up No swelling in face and hands  Zone 2: CAUTION Call your doctor's office for any of the following:  BP reading is greater than 140 (top number) or greater than 90 (bottom number)  Stomach pain under your ribs in the middle or right side Headaches or seeing spots Feeling nauseated or throwing up Swelling in face and hands  Zone 3: EMERGENCY  Seek immediate medical care if you have any of the following:  BP reading is greater than160 (top number) or greater than 110 (bottom number) Severe headaches not improving with Tylenol Serious difficulty catching your breath Any worsening symptoms from Zone 2   Third Trimester of Pregnancy The third trimester is from week 29 through week 42, months 7 through 9. The third trimester is a time when the fetus is growing rapidly. At the end of the ninth month, the fetus is about 20 inches in length and weighs 6-10 pounds.  BODY CHANGES Your body goes through many changes during pregnancy. The changes vary from woman to woman.  Your weight will continue to increase. You can expect to gain 25-35 pounds (11-16 kg) by the end of the pregnancy. You may begin to get stretch marks on your hips, abdomen,  and breasts. You may urinate more often because the fetus is moving lower into your pelvis and pressing on your bladder. You may develop or continue to have heartburn as a result of your pregnancy. You may develop constipation because certain hormones are causing the muscles that push waste through your intestines to slow down. You may develop hemorrhoids or swollen, bulging veins (varicose veins). You may have pelvic pain because of the weight gain and pregnancy hormones relaxing your joints between the bones in your pelvis. Backaches may result from overexertion of the muscles supporting your posture. You may have changes in your hair. These can include thickening of your hair, rapid growth, and changes in texture. Some women also have hair loss during or after pregnancy, or hair that feels dry or thin. Your hair will most likely return to normal after your baby is born. Your breasts will continue to grow and be tender. A yellow discharge may leak from your breasts called colostrum. Your belly button may stick out. You may   feel short of breath because of your expanding uterus. You may notice the fetus "dropping," or moving lower in your abdomen. You may have a bloody mucus discharge. This usually occurs a few days to a week before labor begins. Your cervix becomes thin and soft (effaced) near your due date. WHAT TO EXPECT AT YOUR PRENATAL EXAMS  You will have prenatal exams every 2 weeks until week 36. Then, you will have weekly prenatal exams. During a routine prenatal visit: You will be weighed to make sure you and the fetus are growing normally. Your blood pressure is taken. Your abdomen will be measured to track your baby's growth. The fetal heartbeat will be listened to. Any test results from the previous visit will be discussed. You may have a cervical check near your due date to see if you have effaced. At around 36 weeks, your caregiver will check your cervix. At the same time, your  caregiver will also perform a test on the secretions of the vaginal tissue. This test is to determine if a type of bacteria, Group B streptococcus, is present. Your caregiver will explain this further. Your caregiver may ask you: What your birth plan is. How you are feeling. If you are feeling the baby move. If you have had any abnormal symptoms, such as leaking fluid, bleeding, severe headaches, or abdominal cramping. If you have any questions. Other tests or screenings that may be performed during your third trimester include: Blood tests that check for low iron levels (anemia). Fetal testing to check the health, activity level, and growth of the fetus. Testing is done if you have certain medical conditions or if there are problems during the pregnancy. FALSE LABOR You may feel small, irregular contractions that eventually go away. These are called Braxton Hicks contractions, or false labor. Contractions may last for hours, days, or even weeks before true labor sets in. If contractions come at regular intervals, intensify, or become painful, it is best to be seen by your caregiver.  SIGNS OF LABOR  Menstrual-like cramps. Contractions that are 5 minutes apart or less. Contractions that start on the top of the uterus and spread down to the lower abdomen and back. A sense of increased pelvic pressure or back pain. A watery or bloody mucus discharge that comes from the vagina. If you have any of these signs before the 37th week of pregnancy, call your caregiver right away. You need to go to the hospital to get checked immediately. HOME CARE INSTRUCTIONS  Avoid all smoking, herbs, alcohol, and unprescribed drugs. These chemicals affect the formation and growth of the baby. Follow your caregiver's instructions regarding medicine use. There are medicines that are either safe or unsafe to take during pregnancy. Exercise only as directed by your caregiver. Experiencing uterine cramps is a good sign to  stop exercising. Continue to eat regular, healthy meals. Wear a good support bra for breast tenderness. Do not use hot tubs, steam rooms, or saunas. Wear your seat belt at all times when driving. Avoid raw meat, uncooked cheese, cat litter boxes, and soil used by cats. These carry germs that can cause birth defects in the baby. Take your prenatal vitamins. Try taking a stool softener (if your caregiver approves) if you develop constipation. Eat more high-fiber foods, such as fresh vegetables or fruit and whole grains. Drink plenty of fluids to keep your urine clear or pale yellow. Take warm sitz baths to soothe any pain or discomfort caused by hemorrhoids. Use hemorrhoid cream if   your caregiver approves. If you develop varicose veins, wear support hose. Elevate your feet for 15 minutes, 3-4 times a day. Limit salt in your diet. Avoid heavy lifting, wear low heal shoes, and practice good posture. Rest a lot with your legs elevated if you have leg cramps or low back pain. Visit your dentist if you have not gone during your pregnancy. Use a soft toothbrush to brush your teeth and be gentle when you floss. A sexual relationship may be continued unless your caregiver directs you otherwise. Do not travel far distances unless it is absolutely necessary and only with the approval of your caregiver. Take prenatal classes to understand, practice, and ask questions about the labor and delivery. Make a trial run to the hospital. Pack your hospital bag. Prepare the baby's nursery. Continue to go to all your prenatal visits as directed by your caregiver. SEEK MEDICAL CARE IF: You are unsure if you are in labor or if your water has broken. You have dizziness. You have mild pelvic cramps, pelvic pressure, or nagging pain in your abdominal area. You have persistent nausea, vomiting, or diarrhea. You have a bad smelling vaginal discharge. You have pain with urination. SEEK IMMEDIATE MEDICAL CARE IF:  You  have a fever. You are leaking fluid from your vagina. You have spotting or bleeding from your vagina. You have severe abdominal cramping or pain. You have rapid weight loss or gain. You have shortness of breath with chest pain. You notice sudden or extreme swelling of your face, hands, ankles, feet, or legs. You have not felt your baby move in over an hour. You have severe headaches that do not go away with medicine. You have vision changes. Document Released: 01/21/2001 Document Revised: 02/01/2013 Document Reviewed: 03/30/2012 ExitCare Patient Information 2015 [employer]. This information is not intended to replace advice given to you by your health care provider. Make sure you discuss any questions you have with your health care provider.       

## 2020-07-31 NOTE — Progress Notes (Signed)
LOW-RISK PREGNANCY VISIT Patient name: Alisha Norman MRN [id number]  Date of birth: Jun 19, 2000 Chief Complaint:   Routine Prenatal Visit  History of Present Illness:   Alisha Norman is a 20 y.o. G58P0000 female at [redacted]w[redacted]d with an Estimated Date of Delivery: 10/16/20 being seen today for ongoing management of a low-risk pregnancy.   Today she reports  some low back pain/pressure . WB class in July.  Contractions: Not present.  .  Movement: Present. denies leaking of fluid.  Depression screen [facility] 2/9 04/25/2020 10/25/2019 03/23/2017  Decreased Interest 0 0 0  Down, Depressed, Hopeless 0 0 0  PHQ - 2 Score 0 0 0  Altered sleeping 0 0 -  Tired, decreased energy 1 0 -  Change in appetite 0 0 -  Feeling bad or failure about yourself  0 0 -  Trouble concentrating 0 0 -  Moving slowly or fidgety/restless 0 0 -  Suicidal thoughts 0 0 -  PHQ-9 Score 1 0 -     GAD 7 : Generalized Anxiety Score 04/25/2020 10/25/2019  Nervous, Anxious, on Edge 0 0  Control/stop worrying 0 0  Worry too much - different things 0 0  Trouble relaxing 0 0  Restless 0 0  Easily annoyed or irritable 0 0  Afraid - awful might happen 0 0  Total GAD 7 Score 0 0      Review of Systems:   Pertinent items are noted in HPI Denies abnormal vaginal discharge w/ itching/odor/irritation, headaches, visual changes, shortness of breath, chest pain, abdominal pain, severe nausea/vomiting, or problems with urination or bowel movements unless otherwise stated above. Pertinent History Reviewed:  Reviewed past medical,surgical, social, obstetrical and family history.  Reviewed problem list, medications and allergies. Physical Assessment:   Vitals:   07/31/20 1607  BP: 122/70  Pulse: 99  Weight: 219 lb 4.8 oz (99.5 kg)  Body mass index is 33.34 kg/m.        Physical Examination:   General appearance: Well appearing, and in no distress  Mental status: Alert, oriented to person, place, and time  Skin: Warm &  dry  Cardiovascular: Normal heart rate noted  Respiratory: Normal respiratory effort, no distress  Abdomen: Soft, gravid, nontender  Pelvic: Cervical exam deferred         Extremities: Edema: Trace  Fetal Status: Fetal Heart Rate (bpm): 140 Fundal Height: 28 cm Movement: Present    Chaperone: N/A   No results found for this or any previous visit (from the past 24 hour(s)).  Assessment & Plan:  1) Low-risk pregnancy G1P0000 at [redacted]w[redacted]d with an Estimated Date of Delivery: 10/16/20   2) Interested in [place], class in July, send [place] certificate  3) Anemia> rpt hgb today 9.8, changed Fe to ferrous sulfate, rpt next visit  4) Low back/pelvic pain> belt/tape   Meds: No orders of the defined types were placed in this encounter.  Labs/procedures today: tdap and fingerstick hgb  Plan:  Continue routine obstetrical care  Next visit: prefers in person    Reviewed: Preterm labor symptoms and general obstetric precautions including but not limited to vaginal bleeding, contractions, leaking of fluid and fetal movement were reviewed in detail with the patient.  All questions were answered. Does have home bp cuff. Office bp cuff given: not applicable. Check bp weekly, let us know if consistently >140 and/or >90.  Follow-up: Return in about 3 weeks (around 08/21/2020) for LROB, CNM, in person.  No future appointments.  No orders  of the defined types were placed in this encounter.  Cheral Marker CNM, [facility] 07/31/2020 4:31 PM

## 2020-08-22 ENCOUNTER — Other Ambulatory Visit: Payer: Self-pay

## 2020-08-22 ENCOUNTER — Ambulatory Visit (INDEPENDENT_AMBULATORY_CARE_PROVIDER_SITE_OTHER): Payer: Managed Care, Other (non HMO) | Admitting: Advanced Practice Midwife

## 2020-08-22 VITALS — BP 116/72 | HR 103 | Wt 226.0 lb

## 2020-08-22 DIAGNOSIS — Z3402 Encounter for supervision of normal first pregnancy, second trimester: Secondary | ICD-10-CM

## 2020-08-22 DIAGNOSIS — Z3A32 32 weeks gestation of pregnancy: Secondary | ICD-10-CM

## 2020-08-22 NOTE — Patient Instructions (Signed)
Alisha Norman, thank you for choosing our office today! We appreciate the opportunity to meet your healthcare needs. You may receive a short survey by mail, e-mail, or through [employer]. If you are happy with your care we would appreciate if you could take just a few minutes to complete the survey questions. We read all of your comments and take your feedback very seriously. Thank you again for choosing our office.  Center for [employer] Team at [facility]  [facility] at [facility] ([address] [place], [place] [address]) Entrance C, located off of [address] Free 24/7 valet parking   CLASSES: Go to [email or web address] to register for classes (childbirth, breastfeeding, waterbirth, infant CPR, daddy bootcamp, etc.)  Call the office [phone]) or go to [facility] if: You begin to have strong, frequent contractions Your water breaks.  Sometimes it is a big gush of fluid, sometimes it is just a trickle that keeps getting your panties wet or running down your legs You have vaginal bleeding.  It is normal to have a small amount of spotting if your cervix was checked.  You don't feel your baby moving like normal.  If you don't, get you something to eat and drink and lay down and focus on feeling your baby move.   If your baby is still not moving like normal, you should call the office or go to [facility].  Call the office [phone]) or go to [facility] hospital for these signs of pre-eclampsia: Severe headache that does not go away with Tylenol Visual changes- seeing spots, double, blurred vision Pain under your right breast or upper abdomen that does not go away with Tums or heartburn medicine Nausea and/or vomiting Severe swelling in your hands, feet, and face   Tdap Vaccine It is recommended that you get the Tdap vaccine during the third trimester of EACH pregnancy to help protect your baby from getting pertussis (whooping cough) 27-36 weeks is the BEST time to do  this so that you can pass the protection on to your baby. During pregnancy is better than after pregnancy, but if you are unable to get it during pregnancy it will be offered at the hospital.  You can get this vaccine with [place], at the health department, your family doctor, or some local pharmacies Everyone who will be around your baby should also be up-to-date on their vaccines before the baby comes. Adults (who are not pregnant) only need 1 dose of Tdap during adulthood.   [facility] Pediatricians/Family Doctors [facility]): [address] Colette Ribas, [phone]           [facility]: [address], [phone]                [facility] Medicine [facility]): [address], [phone] (call to ask if accepting patients) [facility] Department: [address], [place], [phone]    [facility] Pediatricians/Family Doctors [facility]): [phone] [address], [phone] [facility]: [address] [place], [phone] [facility]: [address], [phone]  [facility] Doctors  [facility]): [phone] [facility]: [address], [phone]   [facility] Doctors [facility]: [address] [phone]  [facility] Family Doctors  [facility]: [phone], [phone]  Home Blood Pressure Monitoring for Patients   Your provider has recommended that you check your  blood pressure (BP) at least once a week at home. If you do not have a blood pressure cuff at home, one will be provided for you. Contact your provider if you have not received your monitor within 1 week.   Helpful Tips for Accurate Home Blood Pressure Checks  Don't smoke, exercise, or drink caffeine 30 minutes before checking your BP Use the restroom before checking your BP (a full bladder can raise your  pressure) Relax in a comfortable upright chair Feet on the ground Left arm resting comfortably on a flat surface at the level of your heart Legs uncrossed Back supported Sit quietly and don't talk Place the cuff on your bare arm Adjust snuggly, so that only two fingertips can fit between your skin and the top of the cuff Check 2 readings separated by at least one minute Keep a log of your BP readings For a visual, please reference this diagram: http://ccnc.care/bpdiagram  Provider Name: [facility]     Phone: [phone]  Zone 1: ALL CLEAR  Continue to monitor your symptoms:  BP reading is less than 140 (top number) or less than 90 (bottom number)  No right upper stomach pain No headaches or seeing spots No feeling nauseated or throwing up No swelling in face and hands  Zone 2: CAUTION Call your doctor's office for any of the following:  BP reading is greater than 140 (top number) or greater than 90 (bottom number)  Stomach pain under your ribs in the middle or right side Headaches or seeing spots Feeling nauseated or throwing up Swelling in face and hands  Zone 3: EMERGENCY  Seek immediate medical care if you have any of the following:  BP reading is greater than160 (top number) or greater than 110 (bottom number) Severe headaches not improving with Tylenol Serious difficulty catching your breath Any worsening symptoms from Zone 2   Third Trimester of Pregnancy The third trimester is from week 29 through week 42, months 7 through 9. The third trimester is a time when the fetus is growing rapidly. At the end of the ninth month, the fetus is about 20 inches in length and weighs 6-10 pounds.  BODY CHANGES Your body goes through many changes during pregnancy. The changes vary from woman to woman.  Your weight will continue to increase. You can expect to gain 25-35 pounds (11-16 kg) by the end of the pregnancy. You may begin to get stretch marks on your hips, abdomen,  and breasts. You may urinate more often because the fetus is moving lower into your pelvis and pressing on your bladder. You may develop or continue to have heartburn as a result of your pregnancy. You may develop constipation because certain hormones are causing the muscles that push waste through your intestines to slow down. You may develop hemorrhoids or swollen, bulging veins (varicose veins). You may have pelvic pain because of the weight gain and pregnancy hormones relaxing your joints between the bones in your pelvis. Backaches may result from overexertion of the muscles supporting your posture. You may have changes in your hair. These can include thickening of your hair, rapid growth, and changes in texture. Some women also have hair loss during or after pregnancy, or hair that feels dry or thin. Your hair will most likely return to normal after your baby is born. Your breasts will continue to grow and be tender. A yellow discharge may leak from your breasts called colostrum. Your belly button may stick out. You may   feel short of breath because of your expanding uterus. You may notice the fetus "dropping," or moving lower in your abdomen. You may have a bloody mucus discharge. This usually occurs a few days to a week before labor begins. Your cervix becomes thin and soft (effaced) near your due date. WHAT TO EXPECT AT YOUR PRENATAL EXAMS  You will have prenatal exams every 2 weeks until week 36. Then, you will have weekly prenatal exams. During a routine prenatal visit: You will be weighed to make sure you and the fetus are growing normally. Your blood pressure is taken. Your abdomen will be measured to track your baby's growth. The fetal heartbeat will be listened to. Any test results from the previous visit will be discussed. You may have a cervical check near your due date to see if you have effaced. At around 36 weeks, your caregiver will check your cervix. At the same time, your  caregiver will also perform a test on the secretions of the vaginal tissue. This test is to determine if a type of bacteria, Group B streptococcus, is present. Your caregiver will explain this further. Your caregiver may ask you: What your birth plan is. How you are feeling. If you are feeling the baby move. If you have had any abnormal symptoms, such as leaking fluid, bleeding, severe headaches, or abdominal cramping. If you have any questions. Other tests or screenings that may be performed during your third trimester include: Blood tests that check for low iron levels (anemia). Fetal testing to check the health, activity level, and growth of the fetus. Testing is done if you have certain medical conditions or if there are problems during the pregnancy. FALSE LABOR You may feel small, irregular contractions that eventually go away. These are called Braxton Hicks contractions, or false labor. Contractions may last for hours, days, or even weeks before true labor sets in. If contractions come at regular intervals, intensify, or become painful, it is best to be seen by your caregiver.  SIGNS OF LABOR  Menstrual-like cramps. Contractions that are 5 minutes apart or less. Contractions that start on the top of the uterus and spread down to the lower abdomen and back. A sense of increased pelvic pressure or back pain. A watery or bloody mucus discharge that comes from the vagina. If you have any of these signs before the 37th week of pregnancy, call your caregiver right away. You need to go to the hospital to get checked immediately. HOME CARE INSTRUCTIONS  Avoid all smoking, herbs, alcohol, and unprescribed drugs. These chemicals affect the formation and growth of the baby. Follow your caregiver's instructions regarding medicine use. There are medicines that are either safe or unsafe to take during pregnancy. Exercise only as directed by your caregiver. Experiencing uterine cramps is a good sign to  stop exercising. Continue to eat regular, healthy meals. Wear a good support bra for breast tenderness. Do not use hot tubs, steam rooms, or saunas. Wear your seat belt at all times when driving. Avoid raw meat, uncooked cheese, cat litter boxes, and soil used by cats. These carry germs that can cause birth defects in the baby. Take your prenatal vitamins. Try taking a stool softener (if your caregiver approves) if you develop constipation. Eat more high-fiber foods, such as fresh vegetables or fruit and whole grains. Drink plenty of fluids to keep your urine clear or pale yellow. Take warm sitz baths to soothe any pain or discomfort caused by hemorrhoids. Use hemorrhoid cream if   your caregiver approves. If you develop varicose veins, wear support hose. Elevate your feet for 15 minutes, 3-4 times a day. Limit salt in your diet. Avoid heavy lifting, wear low heal shoes, and practice good posture. Rest a lot with your legs elevated if you have leg cramps or low back pain. Visit your dentist if you have not gone during your pregnancy. Use a soft toothbrush to brush your teeth and be gentle when you floss. A sexual relationship may be continued unless your caregiver directs you otherwise. Do not travel far distances unless it is absolutely necessary and only with the approval of your caregiver. Take prenatal classes to understand, practice, and ask questions about the labor and delivery. Make a trial run to the hospital. Pack your hospital bag. Prepare the baby's nursery. Continue to go to all your prenatal visits as directed by your caregiver. SEEK MEDICAL CARE IF: You are unsure if you are in labor or if your water has broken. You have dizziness. You have mild pelvic cramps, pelvic pressure, or nagging pain in your abdominal area. You have persistent nausea, vomiting, or diarrhea. You have a bad smelling vaginal discharge. You have pain with urination. SEEK IMMEDIATE MEDICAL CARE IF:  You  have a fever. You are leaking fluid from your vagina. You have spotting or bleeding from your vagina. You have severe abdominal cramping or pain. You have rapid weight loss or gain. You have shortness of breath with chest pain. You notice sudden or extreme swelling of your face, hands, ankles, feet, or legs. You have not felt your baby move in over an hour. You have severe headaches that do not go away with medicine. You have vision changes. Document Released: 01/21/2001 Document Revised: 02/01/2013 Document Reviewed: 03/30/2012 [facility] Patient Information 2015 [place], [place]. This information is not intended to replace advice given to you by your health care provider. Make sure you discuss any questions you have with your health care provider.

## 2020-08-22 NOTE — Progress Notes (Signed)
   LOW-RISK PREGNANCY VISIT Patient name: Alisha Norman MRN [id number]  Date of birth: July 16, 2000 Chief Complaint:   Routine Prenatal Visit  History of Present Illness:   Alisha Norman is a 20 y.o. G31P0000 female at [redacted]w[redacted]d with an Estimated Date of Delivery: 10/16/20 being seen today for ongoing management of a low-risk pregnancy.  Today she reports  pelvic heaviness, but otherwise doing well . Contractions: Not present. Vag. Bleeding: None.  Movement: Present. denies leaking of fluid. Review of Systems:   Pertinent items are noted in HPI Denies abnormal vaginal discharge w/ itching/odor/irritation, headaches, visual changes, shortness of breath, chest pain, abdominal pain, severe nausea/vomiting, or problems with urination or bowel movements unless otherwise stated above. Pertinent History Reviewed:  Reviewed past medical,surgical, social, obstetrical and family history.  Reviewed problem list, medications and allergies. Physical Assessment:   Vitals:   08/22/20 1615  BP: 116/72  Pulse: (!) 103  Weight: 226 lb (102.5 kg)  Body mass index is 34.36 kg/m.        Physical Examination:   General appearance: Well appearing, and in no distress  Mental status: Alert, oriented to person, place, and time  Skin: Warm & dry  Cardiovascular: Normal heart rate noted  Respiratory: Normal respiratory effort, no distress  Abdomen: Soft, gravid, nontender  Pelvic: Cervical exam deferred         Extremities: Edema: Trace  Fetal Status: Fetal Heart Rate (bpm): 136 Fundal Height: 32 cm Movement: Present    No results found for this or any previous visit (from the past 24 hour(s)).  Assessment & Plan:  1) Low-risk pregnancy G1P0000 at [redacted]w[redacted]d with an Estimated Date of Delivery: 10/16/20   2) Interested in unmedicated birth/waterbirth, has WB class next week; has decided to forego a doula; will sign WB consent ~ 36wk  3) Anemia, taking Fe QOD; will recheck Hgb at next visit   Meds: No orders of the  defined types were placed in this encounter.  Labs/procedures today: none  Plan:  Continue routine obstetrical care   Reviewed: Preterm labor symptoms and general obstetric precautions including but not limited to vaginal bleeding, contractions, leaking of fluid and fetal movement were reviewed in detail with the patient.  All questions were answered. Has home bp cuff. Check bp weekly, let us know if >140/90.   Follow-up: Return in about 2 weeks (around 09/05/2020) for LROB, in person.  No orders of the defined types were placed in this encounter.  Arabella Merles CNM 08/22/2020 4:35 PM

## 2020-08-23 ENCOUNTER — Encounter (HOSPITAL_COMMUNITY): Payer: Self-pay | Admitting: Obstetrics & Gynecology

## 2020-08-23 ENCOUNTER — Inpatient Hospital Stay (HOSPITAL_COMMUNITY)
Admission: AD | Admit: 2020-08-23 | Discharge: 2020-08-23 | Disposition: A | Discharge status: Home / Self Care | Payer: Managed Care, Other (non HMO) | Attending: Obstetrics & Gynecology | Admitting: Obstetrics & Gynecology

## 2020-08-23 DIAGNOSIS — Z885 Allergy status to narcotic agent status: Secondary | ICD-10-CM | POA: Insufficient documentation

## 2020-08-23 DIAGNOSIS — Z3A32 32 weeks gestation of pregnancy: Secondary | ICD-10-CM | POA: Insufficient documentation

## 2020-08-23 DIAGNOSIS — Z880 Allergy status to penicillin: Secondary | ICD-10-CM | POA: Insufficient documentation

## 2020-08-23 DIAGNOSIS — O4693 Antepartum hemorrhage, unspecified, third trimester: Secondary | ICD-10-CM | POA: Insufficient documentation

## 2020-08-23 DIAGNOSIS — O4700 False labor before 37 completed weeks of gestation, unspecified trimester: Secondary | ICD-10-CM

## 2020-08-23 DIAGNOSIS — Z7982 Long term (current) use of aspirin: Secondary | ICD-10-CM | POA: Insufficient documentation

## 2020-08-23 LAB — URINALYSIS, ROUTINE W REFLEX MICROSCOPIC
Bilirubin Urine: NEGATIVE
Glucose, UA: NEGATIVE mg/dL
Hgb urine dipstick: NEGATIVE
Ketones, ur: NEGATIVE mg/dL
Nitrite: NEGATIVE
Protein, ur: NEGATIVE mg/dL
Specific Gravity, Urine: 1.006 (ref 1.005–1.030)
pH: 7 (ref 5.0–8.0)

## 2020-08-23 LAB — WET PREP, GENITAL
Clue Cells Wet Prep HPF POC: NONE SEEN
Sperm: NONE SEEN
Trich, Wet Prep: NONE SEEN
Yeast Wet Prep HPF POC: NONE SEEN

## 2020-08-23 LAB — FETAL FIBRONECTIN: Fetal Fibronectin: NEGATIVE

## 2020-08-23 MED ORDER — BETAMETHASONE SOD PHOS & ACET 6 (3-3) MG/ML IJ SUSP
12.0000 mg | Freq: Once | INTRAMUSCULAR | Status: AC
  Administered 2020-08-23: 12 mg via INTRAMUSCULAR
  Filled 2020-08-23: qty 5

## 2020-08-23 MED ORDER — NIFEDIPINE 10 MG PO CAPS
10.0000 mg | ORAL_CAPSULE | ORAL | Status: AC | PRN
  Administered 2020-08-23 (×4): 10 mg via ORAL
  Filled 2020-08-23 (×4): qty 1

## 2020-08-23 MED ORDER — LACTATED RINGERS IV SOLN: Freq: Once | INTRAVENOUS | Status: AC

## 2020-08-23 NOTE — MAU Provider Note (Signed)
Chief Complaint:  Contractions   Event Date/Time   First Provider Initiated Contact with Patient 08/23/20 0229     HPI: Alisha Norman is a 20 y.o. G1P0000 at 3w2dwho presents to maternity admissions reporting painful contractions for the past hour or so.  Has never had them before. . She reports good fetal movement, denies LOF, vaginal bleeding, vaginal itching/burning, urinary symptoms, h/a, dizziness, n/v, diarrhea, constipation or fever/chills.  She denies headache, visual changes or RUQ abdominal pain.  Vaginal Bleeding The patient's primary symptoms include pelvic pain. The patient's pertinent negatives include no genital itching, genital lesions, genital odor or vaginal bleeding. This is a new problem. The current episode started today. The problem occurs intermittently. The problem has been unchanged. The pain is moderate. She is pregnant. Associated symptoms include abdominal pain and back pain. Pertinent negatives include no constipation, diarrhea, dysuria, fever, frequency, headaches, nausea or vomiting. Nothing aggravates the symptoms. She has tried nothing for the symptoms.   RN Note: [name] is a 19 y.o. at [redacted]w[redacted]d here in MAU reporting: Ctx that started about 100 am happening about every 5 mins, with a pain rating of 7/10. Pt denies LOF, VB, and DFM.   Onset of complaint: 0100 7/14  Past Medical History: Past Medical History:  Diagnosis Date   Amnesia    Concussion    Dermatitis    Enlarged liver    Headache    Hepatitis     Past obstetric history: OB History  Gravida Para Term Preterm AB Living  1 0 0 0 0 0  SAB IAB Ectopic Multiple Live Births  0 0 0 0 0    # Outcome Date GA Lbr Len/2nd Weight Sex Delivery Anes PTL Lv  1 Current             Past Surgical History: History reviewed. No pertinent surgical history.  Family History: Family History  Problem Relation Age of Onset   Heart attack Paternal Grandfather    Diabetes Maternal Grandmother      Social History: Social History   Tobacco Use   Smoking status: Never   Smokeless tobacco: Never  Vaping Use   Vaping Use: Never used  Substance Use Topics   Alcohol use: No   Drug use: No    Allergies:  Allergies  Allergen Reactions   Codeine    Penicillins Itching and Rash    Mother and patient state that any "-cillins" will cause these reactions. Has patient had a PCN reaction causing immediate rash, facial/tongue/throat swelling, SOB or lightheadedness with hypotension: Yes Has patient had a PCN reaction causing severe rash involving mucus membranes or skin necrosis: No Has patient had a PCN reaction that required hospitalization No Has patient had a PCN reaction occurring within the last 10 years: Yes If all of the above answers are "NO", then may proceed with Cephal    Meds:  Medications Prior to Admission  Medication Sig Dispense Refill Last Dose   aspirin 81 MG EC tablet Take 1 tablet (81 mg total) by mouth daily. Swallow whole. 90 tablet 3 08/22/2020   ferrous sulfate 325 (65 FE) MG tablet Take 1 tablet (325 mg total) by mouth every other day. 45 tablet 2 08/22/2020   Prenatal Vit-Fe Fumarate-FA (PRENATAL VITAMIN PO) Take by mouth daily.   08/22/2020   Blood Pressure Monitor MISC For regular home bp monitoring during pregnancy (Patient not taking: Reported on 08/22/2020) 1 each 0     I have reviewed patient's  Past Medical Hx, Surgical Hx, Family Hx, Social Hx, medications and allergies.   ROS:  Review of Systems  Constitutional:  Negative for fever.  Gastrointestinal:  Positive for abdominal pain. Negative for constipation, diarrhea, nausea and vomiting.  Genitourinary:  Positive for pelvic pain and vaginal bleeding. Negative for dysuria and frequency.  Musculoskeletal:  Positive for back pain.  Neurological:  Negative for headaches.  Other systems negative  Physical Exam  Patient Vitals for the past 24 hrs:  BP Temp Temp src Pulse Resp SpO2 Height Weight   08/23/20 0220 137/79 97.7 F (36.5 C) Oral (!) 110 17 100 % 5\' 9"  (1.753 m) 103 kg   Constitutional: Well-developed, well-nourished female in no acute distress.  Cardiovascular: normal rate and rhythm Respiratory: normal effort, clear to auscultation bilaterally GI: Abd soft, non-tender, gravid appropriate for gestational age.   No rebound or guarding. MS: Extremities nontender, no edema, normal ROM Neurologic: Alert and oriented x 4.  GU: Neg CVAT.  PELVIC EXAM:   Dilation: 1.5 Effacement (%): 80 Cervical Position: Middle Station: -3 Presentation: Vertex Exam by:: [email or web address] CNM  FHT:  Baseline 140 , moderate variability, accelerations present, no decelerations Contractions: q 4 mins Irregular     Labs:  O/Positive/-- (03/16 1547)  Imaging:  No results found.  MAU Course/MDM: I have ordered labs and reviewed results.  NST reviewed, reassuring with contractions.   Treatments in MAU included IV hydration, Procardia series, EFM.   Fetal fibronectin was negative UC persisted despite Procardia but pt states they are barely painful and cervix did not change. After 4th dose of Procardia they did diminish and patient felt no pain at all.  Will do second dose of BMZ Friday am  Assessment: [place] IUP at [redacted]w[redacted]d Preterm labor  Plan: Discharge home Preterm Labor precautions and fetal kick counts Repeat BMZ Friday am  Follow up in Office for prenatal visits and recheck cervix Encouraged to return if she develops worsening of symptoms, increase in pain, fever, or other concerning symptoms.  Pt stable at time of discharge.  Friday CNM, MSN Certified Nurse-Midwife 08/23/2020 2:29 AM

## 2020-08-23 NOTE — MAU Note (Signed)
Alisha Norman is a 20 y.o. at [redacted]w[redacted]d here in MAU reporting: Ctx that started about 100 am happening about every 5 mins, with a pain rating of 7/10. Pt denies LOF, VB, and DFM.   Onset of complaint: 0100 7/14

## 2020-08-24 ENCOUNTER — Other Ambulatory Visit: Payer: Self-pay

## 2020-08-24 ENCOUNTER — Inpatient Hospital Stay (HOSPITAL_COMMUNITY)
Admission: AD | Admit: 2020-08-24 | Discharge: 2020-08-24 | Disposition: A | Discharge status: Home / Self Care | Payer: Managed Care, Other (non HMO) | Attending: Obstetrics & Gynecology | Admitting: Obstetrics & Gynecology

## 2020-08-24 DIAGNOSIS — Z3A00 Weeks of gestation of pregnancy not specified: Secondary | ICD-10-CM | POA: Diagnosis not present

## 2020-08-24 DIAGNOSIS — O4700 False labor before 37 completed weeks of gestation, unspecified trimester: Secondary | ICD-10-CM | POA: Insufficient documentation

## 2020-08-24 LAB — GC/CHLAMYDIA PROBE AMP (~~LOC~~) NOT AT ARMC
Chlamydia: NEGATIVE
Comment: NEGATIVE
Comment: NORMAL
Neisseria Gonorrhea: NEGATIVE

## 2020-08-24 MED ORDER — BETAMETHASONE SOD PHOS & ACET 6 (3-3) MG/ML IJ SUSP
12.0000 mg | Freq: Once | INTRAMUSCULAR | Status: AC
  Administered 2020-08-24: 12 mg via INTRAMUSCULAR

## 2020-08-24 NOTE — MAU Note (Signed)
Here for 2nd dose of Betamethasone. No problems with first.  No complaints today.  Denies pain, bleeding, rom , reports +FM

## 2020-08-27 ENCOUNTER — Other Ambulatory Visit: Payer: Self-pay

## 2020-08-27 ENCOUNTER — Encounter: Payer: Self-pay | Admitting: Obstetrics & Gynecology

## 2020-08-27 ENCOUNTER — Ambulatory Visit (INDEPENDENT_AMBULATORY_CARE_PROVIDER_SITE_OTHER): Payer: Managed Care, Other (non HMO) | Admitting: Obstetrics & Gynecology

## 2020-08-27 VITALS — BP 121/75 | HR 99 | Wt 224.0 lb

## 2020-08-27 DIAGNOSIS — Z3A32 32 weeks gestation of pregnancy: Secondary | ICD-10-CM

## 2020-08-27 DIAGNOSIS — Z3403 Encounter for supervision of normal first pregnancy, third trimester: Secondary | ICD-10-CM

## 2020-08-27 DIAGNOSIS — Z029 Encounter for administrative examinations, unspecified: Secondary | ICD-10-CM

## 2020-08-27 MED ORDER — NIFEDIPINE 10 MG PO CAPS: 10.0000 mg | ORAL_CAPSULE | ORAL | 1 refills | Status: DC | PRN

## 2020-08-27 NOTE — Progress Notes (Signed)
   LOW-RISK PREGNANCY VISIT Patient name: Alisha Norman MRN [id number]  Date of birth: [birth year] Chief Complaint:   Routine Prenatal Visit (F/u from [facility])  History of Present Illness:   Alisha Norman is a 20 y.o. G45P0000 female at [redacted]w[redacted]d with an Estimated Date of Delivery: 10/16/20 being seen today for ongoing management of a low-risk pregnancy.  Depression screen [facility] 2/9 04/25/2020 10/25/2019 03/23/2017  Decreased Interest 0 0 0  Down, Depressed, Hopeless 0 0 0  PHQ - 2 Score 0 0 0  Altered sleeping 0 0 -  Tired, decreased energy 1 0 -  Change in appetite 0 0 -  Feeling bad or failure about yourself  0 0 -  Trouble concentrating 0 0 -  Moving slowly or fidgety/restless 0 0 -  Suicidal thoughts 0 0 -  PHQ-9 Score 1 0 -    Today she reports no complaints. Contractions: Not present.  .  Movement: Present. denies leaking of fluid. Review of Systems:   Pertinent items are noted in HPI Denies abnormal vaginal discharge w/ itching/odor/irritation, headaches, visual changes, shortness of breath, chest pain, abdominal pain, severe nausea/vomiting, or problems with urination or bowel movements unless otherwise stated above. Pertinent History Reviewed:  Reviewed past medical,surgical, social, obstetrical and family history.  Reviewed problem list, medications and allergies. Physical Assessment:   Vitals:   08/27/20 1424  BP: 121/75  Pulse: 99  Weight: 224 lb (101.6 kg)  Body mass index is 33.08 kg/m.        Physical Examination:   General appearance: Well appearing, and in no distress  Mental status: Alert, oriented to person, place, and time  Skin: Warm & dry  Cardiovascular: Normal heart rate noted  Respiratory: Normal respiratory effort, no distress  Abdomen: Soft, gravid, nontender  Pelvic: Cervical exam deferred         Extremities: Edema: None  Fetal Status:     Movement: Present    Chaperone: n/a    No results found for this or any previous visit (from the past 24 hour(s)).   Assessment & Plan:  1) Low-risk pregnancy G1P0000 at [redacted]w[redacted]d with an Estimated Date of Delivery: 10/16/20      Meds:  Meds ordered this encounter  Medications   NIFEdipine (PROCARDIA) 10 MG capsule    Sig: Take 1 capsule (10 mg total) by mouth every 4 (four) hours as needed.    Dispense:  30 capsule    Refill:  1   Labs/procedures today:   Plan:  Continue routine obstetrical care  Next visit: prefers in person    Reviewed: Preterm labor symptoms and general obstetric precautions including but not limited to vaginal bleeding, contractions, leaking of fluid and fetal movement were reviewed in detail with the patient.  All questions were answered. Has home bp cuff. Rx faxed to . Check bp weekly, let us know if >140/90.   Follow-up: Return in about 2 weeks (around 09/10/2020) for LROB.  No orders of the defined types were placed in this encounter.   Lazaro Arms, MD 08/27/2020 3:01 PM

## 2020-09-05 ENCOUNTER — Ambulatory Visit (INDEPENDENT_AMBULATORY_CARE_PROVIDER_SITE_OTHER): Payer: Managed Care, Other (non HMO) | Admitting: Obstetrics & Gynecology

## 2020-09-05 ENCOUNTER — Other Ambulatory Visit: Payer: Self-pay

## 2020-09-05 ENCOUNTER — Encounter: Payer: Self-pay | Admitting: Obstetrics & Gynecology

## 2020-09-05 VITALS — BP 129/65 | HR 101 | Wt 225.8 lb

## 2020-09-05 DIAGNOSIS — O99013 Anemia complicating pregnancy, third trimester: Secondary | ICD-10-CM

## 2020-09-05 DIAGNOSIS — Z3403 Encounter for supervision of normal first pregnancy, third trimester: Secondary | ICD-10-CM

## 2020-09-05 DIAGNOSIS — Z3A34 34 weeks gestation of pregnancy: Secondary | ICD-10-CM

## 2020-09-05 LAB — POCT HEMOGLOBIN: Hemoglobin: 10.6 g/dL — AB (ref 11–14.6)

## 2020-09-05 NOTE — Progress Notes (Signed)
   LOW-RISK PREGNANCY VISIT Patient name: Alisha Norman MRN [id number]  Date of birth: 04/09/2000 Chief Complaint:   Routine Prenatal Visit  History of Present Illness:   Alisha Norman is a 20 y.o. G82P0000 female at [redacted]w[redacted]d with an Estimated Date of Delivery: 10/16/20 being seen today for ongoing management of a low-risk pregnancy.   Depression screen [facility] 2/9 04/25/2020 10/25/2019 03/23/2017  Decreased Interest 0 0 0  Down, Depressed, Hopeless 0 0 0  PHQ - 2 Score 0 0 0  Altered sleeping 0 0 -  Tired, decreased energy 1 0 -  Change in appetite 0 0 -  Feeling bad or failure about yourself  0 0 -  Trouble concentrating 0 0 -  Moving slowly or fidgety/restless 0 0 -  Suicidal thoughts 0 0 -  PHQ-9 Score 1 0 -    Today she reports no complaints. Contractions: Not present. Vag. Bleeding: None.  Movement: Present. denies leaking of fluid.  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 Pertinent History Reviewed:  Reviewed past medical,surgical, social, obstetrical and family history.  Reviewed problem list, medications and allergies.  Physical Assessment:   Vitals:   09/05/20 1605  BP: 129/65  Pulse: (!) 101  Weight: 225 lb 12.8 oz (102.4 kg)  Body mass index is 33.34 kg/m.        Physical Examination:   General appearance: Well appearing, and in no distress  Mental status: Alert, oriented to person, place, and time  Skin: Warm & dry  Respiratory: Normal respiratory effort, no distress  Abdomen: Soft, gravid, nontender  Pelvic: Cervical exam deferred         Extremities: Edema: None  Psych:  mood and affect appropriate  Fetal Status: Fetal Heart Rate (bpm): 140 Fundal Height: 33  cm Movement: Present    Chaperone: n/a    Results for orders placed or performed in visit on 09/05/20 (from the past 24 hour(s))  POCT hemoglobin   Collection Time: 09/05/20  4:06 PM  Result Value Ref Range   Hemoglobin 10.6 (A) 11 - 14.6 g/dL     Assessment & Plan:  1) Low-risk pregnancy G1P0000 at [redacted]w[redacted]d with an Estimated Date of Delivery: 10/16/20   2) Anemia- Hgb 10.6 -continue with oral iron and PNV   Meds: No orders of the defined types were placed in this encounter.  Labs/procedures today: none, GBS/GC/C next visit  Plan:  Continue routine obstetrical care.  Desires waterbirth- pt completed course and has certificate.  Advised CNM visit next Next visit: prefers in person    Reviewed: Preterm labor symptoms and general obstetric precautions including but not limited to vaginal bleeding, contractions, leaking of fluid and fetal movement were reviewed in detail with the patient.  All questions were answered. Pt has home bp cuff. Check bp weekly, let us know if >140/90.   Follow-up: Return in about 2 weeks (around 09/19/2020) for LROB visit (Cultures) and CNM visit.  Orders Placed This Encounter  Procedures   POCT hemoglobin    Myna Hidalgo, DO Attending Obstetrician & Gynecologist, [facility] for [employer], [facility]

## 2020-09-11 ENCOUNTER — Encounter: Payer: Managed Care, Other (non HMO) | Admitting: Women's Health

## 2020-09-18 ENCOUNTER — Encounter: Payer: Self-pay | Admitting: Women's Health

## 2020-09-18 ENCOUNTER — Other Ambulatory Visit: Payer: Self-pay

## 2020-09-18 ENCOUNTER — Other Ambulatory Visit (HOSPITAL_COMMUNITY)
Admission: RE | Admit: 2020-09-18 | Discharge: 2020-09-18 | Disposition: A | Discharge status: Home / Self Care | Payer: Managed Care, Other (non HMO) | Source: Ambulatory Visit | Attending: Women's Health | Admitting: Women's Health

## 2020-09-18 ENCOUNTER — Ambulatory Visit (INDEPENDENT_AMBULATORY_CARE_PROVIDER_SITE_OTHER): Payer: Managed Care, Other (non HMO) | Admitting: Women's Health

## 2020-09-18 VITALS — BP 137/85 | HR 104 | Wt 231.8 lb

## 2020-09-18 DIAGNOSIS — Z3403 Encounter for supervision of normal first pregnancy, third trimester: Secondary | ICD-10-CM | POA: Insufficient documentation

## 2020-09-18 DIAGNOSIS — Z3A36 36 weeks gestation of pregnancy: Secondary | ICD-10-CM | POA: Diagnosis not present

## 2020-09-18 DIAGNOSIS — Z1389 Encounter for screening for other disorder: Secondary | ICD-10-CM | POA: Insufficient documentation

## 2020-09-18 NOTE — Patient Instructions (Signed)
Alisha Norman, thank you for choosing our office today! We appreciate the opportunity to meet your healthcare needs. You may receive a short survey by mail, e-mail, or through MyChart. If you are happy with your care we would appreciate if you could take just a few minutes to complete the survey questions. We read all of your comments and take your feedback very seriously. Thank you again for choosing our office.  [facility] ([address] [place], [place] [address]) Entrance C, located off of [address] Free 24/7 valet parking   CLASSES: Go to [email or web address] to register for classes (childbirth, breastfeeding, waterbirth, infant CPR, daddy bootcamp, etc.)  Call the office ([phone]) or go to [facility] if: You begin to have strong, frequent contractions Your water breaks.  Sometimes it is a big gush of fluid, sometimes it is just a trickle that keeps getting your panties wet or running down your legs You have vaginal bleeding.  It is normal to have a small amount of spotting if your cervix was checked.  You don't feel your baby moving like normal.  If you don't, get you something to eat and drink and lay down and focus on feeling your baby move.   If your baby is still not moving like normal, you should call the office or go to [facility].  Call the office ([phone]) or go to [facility] for these signs of pre-eclampsia: Severe headache that does not go away with Tylenol Visual changes- seeing spots, double, blurred vision Pain under your right breast or upper abdomen that does not go away with Tums or heartburn medicine Nausea and/or vomiting Severe swelling in your hands, feet, and face   Tdap Vaccine It is recommended that you get the Tdap vaccine during the third trimester of EACH pregnancy to help protect your baby from getting pertussis (whooping cough) 27-36 weeks is the BEST time to do  this so that you can pass the protection on to your baby. During pregnancy is better than after pregnancy, but if you are unable to get it during pregnancy it will be offered at the hospital.  You can get this vaccine with us, at the health department, your family doctor, or some local pharmacies Everyone who will be around your baby should also be up-to-date on their vaccines before the baby comes. Adults (who are not pregnant) only need 1 dose of Tdap during adulthood.   [place] Pediatricians/Family Doctors [facility] ([facility]): [address], [phone]           [facility]: [address], [phone]                [facility] ([facility]): [address], [phone] (call to ask if accepting patients) [facility]: [address], [place], [phone]    [place] Pediatricians/Family Doctors [facility] ([facility]): [address], [phone] [facility]: [address], [phone] [facility] [place]: [address], [phone]  [place] [facility]  [facility] ([facility]): [phone] [facility]: [address], [phone]   [place] Family Doctors [facility]: [address], [phone]  [place] [facility]  [facility]: [address], [phone]  Home Blood Pressure Monitoring for Patients   Your provider has recommended that you check your   blood pressure (BP) at least once a week at home. If you do not have a blood pressure cuff at home, one will be provided for you. Contact your provider if you have not received your monitor within 1 week.   Helpful Tips for Accurate Home Blood Pressure Checks  Don't smoke, exercise, or drink caffeine 30 minutes before checking your BP Use the restroom before checking your BP (a full bladder can raise your  pressure) Relax in a comfortable upright chair Feet on the ground Left arm resting comfortably on a flat surface at the level of your heart Legs uncrossed Back supported Sit quietly and don't talk Place the cuff on your bare arm Adjust snuggly, so that only two fingertips can fit between your skin and the top of the cuff Check 2 readings separated by at least one minute Keep a log of your BP readings For a visual, please reference this diagram: http://ccnc.care/bpdiagram  Provider Name: [facility]     Phone: [phone]  Zone 1: ALL CLEAR  Continue to monitor your symptoms:  BP reading is less than 140 (top number) or less than 90 (bottom number)  No right upper stomach pain No headaches or seeing spots No feeling nauseated or throwing up No swelling in face and hands  Zone 2: CAUTION Call your doctor's office for any of the following:  BP reading is greater than 140 (top number) or greater than 90 (bottom number)  Stomach pain under your ribs in the middle or right side Headaches or seeing spots Feeling nauseated or throwing up Swelling in face and hands  Zone 3: EMERGENCY  Seek immediate medical care if you have any of the following:  BP reading is greater than160 (top number) or greater than 110 (bottom number) Severe headaches not improving with Tylenol Serious difficulty catching your breath Any worsening symptoms from Zone 2  Preterm Labor and Birth Information  The normal length of a pregnancy is 39-41 weeks. Preterm labor is when labor starts before 37 completed weeks of pregnancy. What are the risk factors for preterm labor? Preterm labor is more likely to occur in women who: Have certain infections during pregnancy such as a bladder infection, sexually transmitted infection, or infection inside the uterus (chorioamnionitis). Have a shorter-than-normal cervix. Have gone into preterm labor before. Have had surgery on their cervix. Are younger than age 17  or older than age 35. Are African American. Are pregnant with twins or multiple babies (multiple gestation). Take street drugs or smoke while pregnant. Do not gain enough weight while pregnant. Became pregnant shortly after having been pregnant. What are the symptoms of preterm labor? Symptoms of preterm labor include: Cramps similar to those that can happen during a menstrual period. The cramps may happen with diarrhea. Pain in the abdomen or lower back. Regular uterine contractions that may feel like tightening of the abdomen. A feeling of increased pressure in the pelvis. Increased watery or bloody mucus discharge from the vagina. Water breaking (ruptured amniotic sac). Why is it important to recognize signs of preterm labor? It is important to recognize signs of preterm labor because babies who are born prematurely may not be fully developed. This can put them at an increased risk for: Long-term (chronic) heart and lung problems. Difficulty immediately after birth with regulating body systems, including blood sugar, body temperature, heart rate, and breathing rate. Bleeding in the brain. Cerebral palsy. Learning difficulties. Death. These risks are highest for babies who are born before 34 weeks   of pregnancy. How is preterm labor treated? Treatment depends on the length of your pregnancy, your condition, and the health of your baby. It may involve: Having a stitch (suture) placed in your cervix to prevent your cervix from opening too early (cerclage). Taking or being given medicines, such as: Hormone medicines. These may be given early in pregnancy to help support the pregnancy. Medicine to stop contractions. Medicines to help mature the baby's lungs. These may be prescribed if the risk of delivery is high. Medicines to prevent your baby from developing cerebral palsy. If the labor happens before 34 weeks of pregnancy, you may need to stay in the hospital. What should I do if I  think I am in preterm labor? If you think that you are going into preterm labor, call your health care provider right away. How can I prevent preterm labor in future pregnancies? To increase your chance of having a full-term pregnancy: Do not use any tobacco products, such as cigarettes, chewing tobacco, and e-cigarettes. If you need help quitting, ask your health care provider. Do not use street drugs or medicines that have not been prescribed to you during your pregnancy. Talk with your health care provider before taking any herbal supplements, even if you have been taking them regularly. Make sure you gain a healthy amount of weight during your pregnancy. Watch for infection. If you think that you might have an infection, get it checked right away. Make sure to tell your health care provider if you have gone into preterm labor before. This information is not intended to replace advice given to you by your health care provider. Make sure you discuss any questions you have with your health care provider. Document Revised: 05/21/2018 Document Reviewed: 06/20/2015 Elsevier Patient Education  [place]? Guide for patients at [facility] for [employer] [facility]) Why consider waterbirth? Gentle birth for babies  Less pain medicine used in labor  May allow for passive descent/less pushing  May reduce perineal tears  More mobility and instinctive maternal position changes  Increased maternal relaxation   Is waterbirth safe? What are the risks of infection, drowning or other complications? Infection:  Very low risk (3.7 % for tub vs 4.8% for bed)  7 in 8000 waterbirths with documented infection  Poorly cleaned equipment most common cause  Slightly lower group B strep transmission rate  Drowning  Maternal:  Very low risk  Related to seizures or fainting  Newborn:  Very low risk. No evidence of increased risk of respiratory problems in multiple large  studies  Physiological protection from breathing under water  Avoid underwater birth if there are any fetal complications  Once baby's head is out of the water, keep it out.  Birth complication  Some reports of cord trauma, but risk decreased by bringing baby to surface gradually  No evidence of increased risk of shoulder dystocia. Mothers can usually change positions faster in water than in a bed, possibly aiding the maneuvers to free the shoulder.   There are 2 things you MUST do to have a waterbirth with [facility]: Attend a waterbirth class at [place] at [facility]   3rd Wednesday of every month from 7-9 pm (virtual during [place]) [employer] at [email or web address] or [email or web address] or by calling [phone] Bring [place] the certificate from the class to your prenatal appointment or send via [place] with a midwife at 36 weeks* to see if you can still plan a waterbirth and  to sign the consent.   *We also recommend that you schedule as many of your prenatal visits with a midwife as possible.    Helpful information: You may want to bring a bathing suit top to the hospital to wear during labor but this is optional.  All other supplies are provided by the hospital. Please arrive at the hospital with signs of active labor, and do not wait at home until late in labor. It takes 45 min- 2 hours for COVID testing, fetal monitoring, and check in to your room to take place, plus transport and filling of the waterbirth tub.    Things that would prevent you from having a waterbirth: Unknown or Positive COVID-19 diagnosis upon admission to hospital* Premature, <37wks  Previous cesarean birth  Presence of thick meconium-stained fluid  Multiple gestation (Twins, triplets, etc.)  Uncontrolled diabetes or gestational diabetes requiring medication  Hypertension diagnosed in pregnancy or preexisting hypertension (gestational hypertension, preeclampsia, or chronic  hypertension) Fetal growth restriction (your baby measures less than 10th percentile on ultrasound) Heavy vaginal bleeding  Non-reassuring fetal heart rate  Active infection (MRSA, etc.). Group B Strep is NOT a contraindication for waterbirth.  If your labor has to be induced and induction method requires continuous monitoring of the baby's heart rate  Other risks/issues identified by your obstetrical provider   Please remember that birth is unpredictable. Under certain unforeseeable circumstances your provider may advise against giving birth in the tub. These decisions will be made on a case-by-case basis and with the safety of you and your baby as our highest priority.   *Please remember that in order to have a waterbirth, you must test Negative to COVID-19 upon admission to the hospital.  Updated 05/21/20

## 2020-09-18 NOTE — Progress Notes (Signed)
LOW-RISK PREGNANCY VISIT Patient name: Alisha Norman MRN [id number]  Date of birth: Jun 14, 2000 Chief Complaint:   Routine Prenatal Visit (Cultures)  History of Present Illness:   Alisha Norman is a 20 y.o. G61P0000 female at [redacted]w[redacted]d with an Estimated Date of Delivery: 10/16/20 being seen today for ongoing management of a low-risk pregnancy.   Today she reports no complaints. Denies ha, visual changes, ruq/epigastric pain, n/v.   Contractions: Irritability.  .  Movement: Present. denies leaking of fluid.  Depression screen [facility] 2/9 04/25/2020 10/25/2019 03/23/2017  Decreased Interest 0 0 0  Down, Depressed, Hopeless 0 0 0  PHQ - 2 Score 0 0 0  Altered sleeping 0 0 -  Tired, decreased energy 1 0 -  Change in appetite 0 0 -  Feeling bad or failure about yourself  0 0 -  Trouble concentrating 0 0 -  Moving slowly or fidgety/restless 0 0 -  Suicidal thoughts 0 0 -  PHQ-9 Score 1 0 -     GAD 7 : Generalized Anxiety Score 04/25/2020 10/25/2019  Nervous, Anxious, on Edge 0 0  Control/stop worrying 0 0  Worry too much - different things 0 0  Trouble relaxing 0 0  Restless 0 0  Easily annoyed or irritable 0 0  Afraid - awful might happen 0 0  Total GAD 7 Score 0 0      Review of Systems:   Pertinent items are noted in HPI Denies abnormal vaginal discharge w/ itching/odor/irritation, headaches, visual changes, shortness of breath, chest pain, abdominal pain, severe nausea/vomiting, or problems with urination or bowel movements unless otherwise stated above. Pertinent History Reviewed:  Reviewed past medical,surgical, social, obstetrical and family history.  Reviewed problem list, medications and allergies. Physical Assessment:   Vitals:   09/18/20 0921  BP: 137/85  Pulse: (!) 104  Weight: 231 lb 12.8 oz (105.1 kg)  Body mass index is 34.23 kg/m.        Physical Examination:   General appearance: Well appearing, and in no distress  Mental status: Alert, oriented to person, place,  and time  Skin: Warm & dry  Cardiovascular: Normal heart rate noted  Respiratory: Normal respiratory effort, no distress  Abdomen: Soft, gravid, nontender  Pelvic: Cervical exam performed  Dilation: 3 Effacement (%): 50 Station: -2  Extremities: Edema: None  Fetal Status: Fetal Heart Rate (bpm): 145 Fundal Height: 37 cm Movement: Present Presentation: Vertex  Chaperone: Peggy Dones   No results found for this or any previous visit (from the past 24 hour(s)).  Assessment & Plan:  1) Low-risk pregnancy G1P0000 at [redacted]w[redacted]d with an Estimated Date of Delivery: 10/16/20   2) Borderline bp, asymptomatic, check bp bid at home-let us know if >140/90, reviewed pre-e s/s, reasons to seek care.   3) Wants waterbirth> has attended class, sending certificate to mychart today, reviewed risks/benefits, consent signed (understands if develops GHTN/pre-e, no longer a candidate)   Meds: No orders of the defined types were placed in this encounter.  Labs/procedures today: GBS, GC/CT, and SVE  Plan:  Continue routine obstetrical care  Next visit: prefers in person    Reviewed: Preterm labor symptoms and general obstetric precautions including but not limited to vaginal bleeding, contractions, leaking of fluid and fetal movement were reviewed in detail with the patient.  All questions were answered. Does have home bp cuff. Office bp cuff given: not applicable. Check bp twice daily, let us know if consistently >140 and/or >90.  Follow-up: Return  in about 1 week (around 09/25/2020) for LROB, CNM, in person.  No future appointments.  Orders Placed This Encounter  Procedures   Strep Gp B NAA+Rflx   Cheral Marker CNM, [facility] 09/18/2020 9:57 AM

## 2020-09-19 ENCOUNTER — Telehealth: Payer: Self-pay

## 2020-09-19 LAB — CERVICOVAGINAL ANCILLARY ONLY
Chlamydia: NEGATIVE
Comment: NEGATIVE
Comment: NORMAL
Neisseria Gonorrhea: NEGATIVE

## 2020-09-19 NOTE — Telephone Encounter (Signed)
Babyscripts called with an elevated BP of 141/80 with no symptoms. Called pt to clarify and ask to retake. Pt stated that she accidentally entered the wrong numbers and checked her machine. She stated the BP reading was 131/88. She tried to erase or change it, but couldn't after it had been submitted.

## 2020-09-20 ENCOUNTER — Telehealth: Payer: Self-pay

## 2020-09-20 ENCOUNTER — Ambulatory Visit (INDEPENDENT_AMBULATORY_CARE_PROVIDER_SITE_OTHER): Payer: Managed Care, Other (non HMO) | Admitting: *Deleted

## 2020-09-20 ENCOUNTER — Other Ambulatory Visit: Payer: Self-pay

## 2020-09-20 VITALS — BP 138/83 | HR 94

## 2020-09-20 DIAGNOSIS — Z3A36 36 weeks gestation of pregnancy: Secondary | ICD-10-CM | POA: Diagnosis not present

## 2020-09-20 DIAGNOSIS — R030 Elevated blood-pressure reading, without diagnosis of hypertension: Secondary | ICD-10-CM

## 2020-09-20 DIAGNOSIS — Z3403 Encounter for supervision of normal first pregnancy, third trimester: Secondary | ICD-10-CM

## 2020-09-20 DIAGNOSIS — O09899 Supervision of other high risk pregnancies, unspecified trimester: Secondary | ICD-10-CM

## 2020-09-20 DIAGNOSIS — Z2839 Other underimmunization status: Secondary | ICD-10-CM

## 2020-09-20 DIAGNOSIS — O99013 Anemia complicating pregnancy, third trimester: Secondary | ICD-10-CM

## 2020-09-20 LAB — STREP GP B NAA+RFLX: Strep Gp B NAA+Rflx: NEGATIVE

## 2020-09-20 LAB — POCT URINALYSIS DIPSTICK OB
Blood, UA: NEGATIVE
Glucose, UA: NEGATIVE
Ketones, UA: NEGATIVE
Leukocytes, UA: NEGATIVE
Nitrite, UA: NEGATIVE
POC,PROTEIN,UA: NEGATIVE

## 2020-09-20 NOTE — Telephone Encounter (Signed)
Pt called after taking BP with readings in left arm of 140/90 and subsequently in right arm of 148/102. Pt was asked which arm she normally takes her BP and she replied her right. She also normally takes it first thing before starting any activity. Today, she took it after she was making breakfast. Pt was asked to sit without activity and retake her BP in her right arm after 15 min. Instructed pt to call back with updated reading. Pt confirmed understanding.

## 2020-09-20 NOTE — Telephone Encounter (Signed)
Pt called back after taking BP with a reading in her right arm of 144/97 and left arm of 120/94. Pt denies any symptoms. Pt scheduled for a nurse visit BP check at 3:10 pm and she was instructed to bring her BP cuff to ensure accuracy. Pt confirmed understanding.

## 2020-09-20 NOTE — Progress Notes (Addendum)
     NURSE VISIT- BLOOD PRESSURE CHECK  SUBJECTIVE:  Alisha Norman is a 20 y.o. G66P0000 female here for BP check. She is [redacted]w[redacted]d pregnant    HYPERTENSION ROS:  Pregnant:  Severe headaches that don't go away with tylenol/other medicines: No  Visual changes (seeing spots/double/blurred vision) No  Severe pain under right breast breast or in center of upper chest No  Severe nausea/vomiting No  Taking medicines as instructed not applicable   OBJECTIVE:  BP 138/83   Pulse 94   LMP 01/10/2020   Appearance alert, well appearing, and in no distress and oriented to person, place, and time.  ASSESSMENT: Pregnancy [redacted]w[redacted]d  blood pressure check with decreased fetal movement today  NST: FHR baseline 140 bpm, Variability: moderate, Accelerations:present, Decelerations:  Absent= Cat 1/reactive Toco: occasional   ASSESSMENT: G1P0000 at [redacted]w[redacted]d with Decreased fetal movement NST reactive EFM strip reviewed by Dr. Despina Hidden  PLAN: Discussed with Dr. Despina Hidden   Recommendations: no changes needed   Follow-up: as scheduled   Jobe Marker  09/20/2020 3:31 PM   Attestation of Attending Supervision of Nursing Visit Encounter: Evaluation and management procedures were performed by the nursing staff under my supervision and collaboration.  I have reviewed the nurse's note and chart, and I agree with the management and plan.  Rockne Coons MD Attending Physician for the Center for [facility] 09/20/2020 5:02 PM

## 2020-09-26 ENCOUNTER — Ambulatory Visit (INDEPENDENT_AMBULATORY_CARE_PROVIDER_SITE_OTHER): Payer: Managed Care, Other (non HMO) | Admitting: Women's Health

## 2020-09-26 ENCOUNTER — Encounter: Payer: Self-pay | Admitting: Women's Health

## 2020-09-26 ENCOUNTER — Other Ambulatory Visit: Payer: Self-pay

## 2020-09-26 VITALS — BP 128/77 | HR 104 | Wt 230.0 lb

## 2020-09-26 DIAGNOSIS — Z3403 Encounter for supervision of normal first pregnancy, third trimester: Secondary | ICD-10-CM

## 2020-09-26 DIAGNOSIS — Z3A37 37 weeks gestation of pregnancy: Secondary | ICD-10-CM

## 2020-09-26 NOTE — Progress Notes (Signed)
LOW-RISK PREGNANCY VISIT Patient name: Alisha Norman MRN [id number]  Date of birth: 04-07-2000 Chief Complaint:   Routine Prenatal Visit  History of Present Illness:   Alisha Norman is a 20 y.o. G27P0000 female at [redacted]w[redacted]d with an Estimated Date of Delivery: 10/16/20 being seen today for ongoing management of a low-risk pregnancy.   Today she reports occasional contractions and pelvic pressure. Contractions: Regular.  .  Movement: Present. Underwear have been wet x 2 days.   Depression screen [facility] 2/9 04/25/2020 10/25/2019 03/23/2017  Decreased Interest 0 0 0  Down, Depressed, Hopeless 0 0 0  PHQ - 2 Score 0 0 0  Altered sleeping 0 0 -  Tired, decreased energy 1 0 -  Change in appetite 0 0 -  Feeling bad or failure about yourself  0 0 -  Trouble concentrating 0 0 -  Moving slowly or fidgety/restless 0 0 -  Suicidal thoughts 0 0 -  PHQ-9 Score 1 0 -     GAD 7 : Generalized Anxiety Score 04/25/2020 10/25/2019  Nervous, Anxious, on Edge 0 0  Control/stop worrying 0 0  Worry too much - different things 0 0  Trouble relaxing 0 0  Restless 0 0  Easily annoyed or irritable 0 0  Afraid - awful might happen 0 0  Total GAD 7 Score 0 0      Review of Systems:   Pertinent items are noted in HPI Denies abnormal vaginal discharge w/ itching/odor/irritation, headaches, visual changes, shortness of breath, chest pain, abdominal pain, severe nausea/vomiting, or problems with urination or bowel movements unless otherwise stated above. Pertinent History Reviewed:  Reviewed past medical,surgical, social, obstetrical and family history.  Reviewed problem list, medications and allergies. Physical Assessment:   Vitals:   09/26/20 1406  BP: 128/77  Pulse: (!) 104  Weight: 230 lb (104.3 kg)  Body mass index is 33.97 kg/m.        Physical Examination:   General appearance: Well appearing, and in no distress  Mental status: Alert, oriented to person, place, and time  Skin: Warm &  dry  Cardiovascular: Normal heart rate noted  Respiratory: Normal respiratory effort, no distress  Abdomen: Soft, gravid, nontender  Pelvic:  SSE: cx visually closed, no pooling, no change w/ valsalva, fern & nitrazine neg   Dilation: 3 Effacement (%): 70 Station: -2  Extremities: Edema: Trace  Fetal Status: Fetal Heart Rate (bpm): 140 Fundal Height: 38 cm Movement: Present Presentation: Vertex  Chaperone: Peggy Dones   No results found for this or any previous visit (from the past 24 hour(s)).  Assessment & Plan:  1) Low-risk pregnancy G1P0000 at [redacted]w[redacted]d with an Estimated Date of Delivery: 10/16/20   2) Interested in waterbirth, class & consent done  3) Vaginal d/c> no evidence of SROM, reviewed labor s/s, reasons to seek care   Meds: No orders of the defined types were placed in this encounter.  Labs/procedures today: spec exam and SVE  Plan:  Continue routine obstetrical care  Next visit: prefers in person    Reviewed: Term labor symptoms and general obstetric precautions including but not limited to vaginal bleeding, contractions, leaking of fluid and fetal movement were reviewed in detail with the patient.  All questions were answered. Does have home bp cuff. Office bp cuff given: not applicable. Check bp weekly, let us know if consistently >140 and/or >90.  Follow-up: Return in about 1 week (around 10/03/2020) for LROB, CNM, in person.  Future Appointments  Date Time Provider Department Center  10/03/2020  9:10 AM Cresenzo-Dishmon, Scarlette Calico, CNM [facility]    No orders of the defined types were placed in this encounter.  Cheral Marker CNM, [facility] 09/26/2020 2:50 PM

## 2020-09-26 NOTE — Patient Instructions (Signed)
Keilly, thank you for choosing our office today! We appreciate the opportunity to meet your healthcare needs. You may receive a short survey by mail, e-mail, or through [employer]. If you are happy with your care we would appreciate if you could take just a few minutes to complete the survey questions. We read all of your comments and take your feedback very seriously. Thank you again for choosing our office.  Center for [employer] Team at [facility]  [facility] ([address] [place], [place] [address]) Entrance C, located off of [address] Free 24/7 valet parking   CLASSES: Go to [email or web address] to register for classes (childbirth, breastfeeding, waterbirth, infant CPR, daddy bootcamp, etc.)  Call the office [phone]) or go to [facility] if: You begin to have strong, frequent contractions Your water breaks.  Sometimes it is a big gush of fluid, sometimes it is just a trickle that keeps getting your panties wet or running down your legs You have vaginal bleeding.  It is normal to have a small amount of spotting if your cervix was checked.  You don't feel your baby moving like normal.  If you don't, get you something to eat and drink and lay down and focus on feeling your baby move.   If your baby is still not moving like normal, you should call the office or go to [facility].  Call the office [phone]) or go to [facility] hospital for these signs of pre-eclampsia: Severe headache that does not go away with Tylenol Visual changes- seeing spots, double, blurred vision Pain under your right breast or upper abdomen that does not go away with Tums or heartburn medicine Nausea and/or vomiting Severe swelling in your hands, feet, and face   [facility] Pediatricians/Family Doctors [facility]): [address] Colette Ribas, [phone]           [facility]: [address], [phone]                 [facility]): [address], [phone] (call to ask if accepting patients) [facility] Department: [address] [place], [phone]    [facility] Pediatricians/Family Doctors [facility]): [address], [phone] [facility]: [address] [place], [phone] [facility]: [address], [phone]  [facility] Doctors  [facility]): [phone] [facility]: [address] [phone]   [facility] Doctors [facility]: [address] [phone]  [facility] Doctors  [facility]: [phone], [phone]  Home Blood Pressure Monitoring for Patients   Your provider has recommended that you check your blood pressure (BP) at least once a week at home. If you do not have a blood pressure cuff at home, one will be provided for you. Contact your provider if you have not received your monitor within 1 week.   Helpful Tips for Accurate Home Blood Pressure Checks  Don't smoke, exercise, or drink caffeine 30 minutes before checking your BP Use the restroom before checking your BP (a full bladder can raise your pressure) Relax in a comfortable upright chair Feet on the ground Left arm resting comfortably on a flat surface at the level of your heart Legs uncrossed Back supported Sit quietly and don't talk Place the cuff on your bare arm Adjust snuggly, so that only two fingertips  can fit between your skin and the top of the cuff Check 2 readings separated by at least one minute Keep a log of your BP readings For a visual, please reference this diagram: http://ccnc.care/bpdiagram  Provider Name: [facility]     Phone: [phone]  Zone 1: ALL CLEAR  Continue to monitor your symptoms:  BP reading is less than 140 (top number) or less than 90 (bottom number)  No right  upper stomach pain No headaches or seeing spots No feeling nauseated or throwing up No swelling in face and hands  Zone 2: CAUTION Call your doctor's office for any of the following:  BP reading is greater than 140 (top number) or greater than 90 (bottom number)  Stomach pain under your ribs in the middle or right side Headaches or seeing spots Feeling nauseated or throwing up Swelling in face and hands  Zone 3: EMERGENCY  Seek immediate medical care if you have any of the following:  BP reading is greater than160 (top number) or greater than 110 (bottom number) Severe headaches not improving with Tylenol Serious difficulty catching your breath Any worsening symptoms from Zone 2   Braxton Hicks Contractions Contractions of the uterus can occur throughout pregnancy, but they are not always a sign that you are in labor. You may have practice contractions called Braxton Hicks contractions. These false labor contractions are sometimes confused with true labor. What are [place] contractions? Braxton Hicks contractions are tightening movements that occur in the muscles of the uterus before labor. Unlike true labor contractions, these contractions do not result in opening (dilation) and thinning of the cervix. Toward the end of pregnancy (32-34 weeks), Braxton Hicks contractions can happen more often and may become stronger. These contractions are sometimes difficult to tell apart from true labor because they can be very uncomfortable. You should not feel embarrassed if you go to the hospital with false labor. Sometimes, the only way to tell if you are in true labor is for your health care provider to look for changes in the cervix. The health care provider will do a physical exam and may monitor your contractions. If you are not in true labor, the exam should show that your cervix is not dilating and your water has not broken. If there are no other health problems associated with your  pregnancy, it is completely safe for you to be sent home with false labor. You may continue to have Braxton Hicks contractions until you go into true labor. How to tell the difference between true labor and false labor True labor Contractions last 30-70 seconds. Contractions become very regular. Discomfort is usually felt in the top of the uterus, and it spreads to the lower abdomen and low back. Contractions do not go away with walking. Contractions usually become more intense and increase in frequency. The cervix dilates and gets thinner. False labor Contractions are usually shorter and not as strong as true labor contractions. Contractions are usually irregular. Contractions are often felt in the front of the lower abdomen and in the groin. Contractions may go away when you walk around or change positions while lying down. Contractions get weaker and are shorter-lasting as time goes on. The cervix usually does not dilate or become thin. Follow these instructions at home:  Take over-the-counter and prescription medicines only as told by your health care provider. Keep up with your usual exercises and follow other instructions from your health care provider. Eat and drink lightly if you think  you are going into labor. If Braxton Hicks contractions are making you uncomfortable: Change your position from lying down or resting to walking, or change from walking to resting. Sit and rest in a tub of warm water. Drink enough fluid to keep your urine pale yellow. Dehydration may cause these contractions. Do slow and deep breathing several times an hour. Keep all follow-up prenatal visits as told by your health care provider. This is important. Contact a health care provider if: You have a fever. You have continuous pain in your abdomen. Get help right away if: Your contractions become stronger, more regular, and closer together. You have fluid leaking or gushing from your vagina. You pass  blood-tinged mucus (bloody show). You have bleeding from your vagina. You have low back pain that you never had before. You feel your baby's head pushing down and causing pelvic pressure. Your baby is not moving inside you as much as it used to. Summary Contractions that occur before labor are called Braxton Hicks contractions, false labor, or practice contractions. Braxton Hicks contractions are usually shorter, weaker, farther apart, and less regular than true labor contractions. True labor contractions usually become progressively stronger and regular, and they become more frequent. Manage discomfort from Braxton Hicks contractions by changing position, resting in a warm bath, drinking plenty of water, or practicing deep breathing. This information is not intended to replace advice given to you by your health care provider. Make sure you discuss any questions you have with your health care provider. Document Revised: 01/09/2017 Document Reviewed: 06/12/2016 Elsevier Patient Education  2020 Elsevier Inc.   

## 2020-09-29 ENCOUNTER — Encounter (HOSPITAL_COMMUNITY): Payer: Self-pay | Admitting: Obstetrics & Gynecology

## 2020-09-29 ENCOUNTER — Inpatient Hospital Stay (HOSPITAL_COMMUNITY)
Admission: AD | Admit: 2020-09-29 | Discharge: 2020-09-29 | Disposition: A | Discharge status: Home / Self Care | Payer: Managed Care, Other (non HMO) | Attending: Obstetrics & Gynecology | Admitting: Obstetrics & Gynecology

## 2020-09-29 ENCOUNTER — Other Ambulatory Visit: Payer: Self-pay | Admitting: Advanced Practice Midwife

## 2020-09-29 DIAGNOSIS — Z7982 Long term (current) use of aspirin: Secondary | ICD-10-CM | POA: Insufficient documentation

## 2020-09-29 DIAGNOSIS — O09899 Supervision of other high risk pregnancies, unspecified trimester: Secondary | ICD-10-CM

## 2020-09-29 DIAGNOSIS — R748 Abnormal levels of other serum enzymes: Secondary | ICD-10-CM | POA: Diagnosis not present

## 2020-09-29 DIAGNOSIS — Z2839 Other underimmunization status: Secondary | ICD-10-CM

## 2020-09-29 DIAGNOSIS — Z880 Allergy status to penicillin: Secondary | ICD-10-CM | POA: Diagnosis not present

## 2020-09-29 DIAGNOSIS — Z885 Allergy status to narcotic agent status: Secondary | ICD-10-CM | POA: Insufficient documentation

## 2020-09-29 DIAGNOSIS — M549 Dorsalgia, unspecified: Secondary | ICD-10-CM | POA: Diagnosis not present

## 2020-09-29 DIAGNOSIS — O99013 Anemia complicating pregnancy, third trimester: Secondary | ICD-10-CM

## 2020-09-29 DIAGNOSIS — R030 Elevated blood-pressure reading, without diagnosis of hypertension: Secondary | ICD-10-CM | POA: Diagnosis not present

## 2020-09-29 DIAGNOSIS — Z3689 Encounter for other specified antenatal screening: Secondary | ICD-10-CM

## 2020-09-29 DIAGNOSIS — R102 Pelvic and perineal pain: Secondary | ICD-10-CM | POA: Diagnosis not present

## 2020-09-29 DIAGNOSIS — Z3A37 37 weeks gestation of pregnancy: Secondary | ICD-10-CM | POA: Diagnosis not present

## 2020-09-29 DIAGNOSIS — Z3403 Encounter for supervision of normal first pregnancy, third trimester: Secondary | ICD-10-CM

## 2020-09-29 DIAGNOSIS — O26893 Other specified pregnancy related conditions, third trimester: Secondary | ICD-10-CM | POA: Diagnosis present

## 2020-09-29 LAB — PROTEIN / CREATININE RATIO, URINE
Creatinine, Urine: 78.99 mg/dL
Protein Creatinine Ratio: 0.09 mg/mg{Cre} (ref 0.00–0.15)
Total Protein, Urine: 7 mg/dL

## 2020-09-29 LAB — COMPREHENSIVE METABOLIC PANEL
ALT: 80 U/L — ABNORMAL HIGH (ref 0–44)
AST: 47 U/L — ABNORMAL HIGH (ref 15–41)
Albumin: 2.9 g/dL — ABNORMAL LOW (ref 3.5–5.0)
Alkaline Phosphatase: 165 U/L — ABNORMAL HIGH (ref 38–126)
Anion gap: 9 (ref 5–15)
BUN: 7 mg/dL (ref 6–20)
CO2: 22 mmol/L (ref 22–32)
Calcium: 9.3 mg/dL (ref 8.9–10.3)
Chloride: 105 mmol/L (ref 98–111)
Creatinine, Ser: 0.69 mg/dL (ref 0.44–1.00)
GFR, Estimated: >60 mL/min (ref >60)
Glucose, Bld: 95 mg/dL (ref 70–99)
Potassium: 3.4 mmol/L — ABNORMAL LOW (ref 3.5–5.1)
Sodium: 136 mmol/L (ref 135–145)
Total Bilirubin: 0.7 mg/dL (ref 0.3–1.2)
Total Protein: 6.4 g/dL — ABNORMAL LOW (ref 6.5–8.1)

## 2020-09-29 LAB — URINALYSIS, ROUTINE W REFLEX MICROSCOPIC
Bilirubin Urine: NEGATIVE
Glucose, UA: NEGATIVE mg/dL
Hgb urine dipstick: NEGATIVE
Ketones, ur: 5 mg/dL — AB
Nitrite: NEGATIVE
Protein, ur: NEGATIVE mg/dL
Specific Gravity, Urine: 1.01 (ref 1.005–1.030)
pH: 6 (ref 5.0–8.0)

## 2020-09-29 LAB — CBC
HCT: 29.8 % — ABNORMAL LOW (ref 36.0–46.0)
Hemoglobin: 9.7 g/dL — ABNORMAL LOW (ref 12.0–15.0)
MCH: 27.4 pg (ref 26.0–34.0)
MCHC: 32.6 g/dL (ref 30.0–36.0)
MCV: 84.2 fL (ref 80.0–100.0)
Platelets: 345 10*3/uL (ref 150–400)
RBC: 3.54 MIL/uL — ABNORMAL LOW (ref 3.87–5.11)
RDW: 13.2 % (ref 11.5–15.5)
WBC: 7.5 10*3/uL (ref 4.0–10.5)
nRBC: 0 % (ref 0.0–0.2)

## 2020-09-29 MED ORDER — ACETAMINOPHEN 500 MG PO TABS
1000.0000 mg | ORAL_TABLET | Freq: Once | ORAL | Status: AC
  Administered 2020-09-29: 1000 mg via ORAL
  Filled 2020-09-29: qty 2

## 2020-09-29 MED ORDER — CYCLOBENZAPRINE HCL 5 MG PO TABS
10.0000 mg | ORAL_TABLET | Freq: Once | ORAL | Status: AC
  Administered 2020-09-29: 10 mg via ORAL
  Filled 2020-09-29: qty 2

## 2020-09-29 NOTE — MAU Note (Addendum)
Pt repositioned to side-repositioning BP cuff-cuff noted to be too short for arm-pt stated that the burgundy cuff is used in the office for her-cuff switched-0405 BP with subsequent with burgundy cuff

## 2020-09-29 NOTE — MAU Note (Signed)
Pt returned from bathroom - stated she rates her back pain at 7-no change from prior to medication.

## 2020-09-29 NOTE — Progress Notes (Signed)
Repeat PEC orders to be collected at [facility] 10/01/2020.  Clayton Bibles, MSN, CNM Certified Nurse Midwife, [facility] for [employer], [facility] 09/29/20 5:37 AM

## 2020-09-29 NOTE — MAU Note (Signed)
Alisha Norman is a 20 y.o. at [redacted]w[redacted]d here in MAU reporting: lower back pain and lower pelvic pain. Reports pain is constant and rates pain at 7 on 0-10 scale.  Onset of complaint: 1800  FHT:130 Lab orders placed from triage:  UA  Pt denies SROM, vaginal bleeding, bloody show or vaginal discharge. Endorses + fetal move movement.  External fetal and labor monitors applied.

## 2020-09-29 NOTE — MAU Provider Note (Signed)
History     CSN: [id number]  Arrival date and time: 09/29/20 3664   Event Date/Time   First Provider Initiated Contact with Patient 09/29/20 [phone]      Chief Complaint  Patient presents with   Back Pain   Pelvic Pain   HPI Alisha Norman is a 20 y.o. G1P0000 at [redacted]w[redacted]d who presents to MAU with chief complaint of abdominal, pelvic and back pain. Patient's dog was hit by a car earlier tonight. She raced to rescue her dog and now feels intense pain in her pelvic and low back. She is not sure if she is in labor. Pain score is 7/10. She did not fall or experience abdominal trauma. She has not taken medication or tried other treatments for this complaint. She denies vaginal bleeding, leaking of fluid, decreased fetal movement, fever, falls, or recent illness.   Patient receives care with [facility].  OB History     Gravida  1   Para  0   Term  0   Preterm  0   AB  0   Living  0      SAB  0   IAB  0   Ectopic  0   Multiple  0   Live Births  0           Past Medical History:  Diagnosis Date   Amnesia    Concussion    Dermatitis    Enlarged liver    Headache    Hepatitis     Past Surgical History:  Procedure Laterality Date   NO PAST SURGERIES      Family History  Problem Relation Age of Onset   Heart attack Paternal Grandfather    Diabetes Maternal Grandmother     Social History   Tobacco Use   Smoking status: Never   Smokeless tobacco: Never  Vaping Use   Vaping Use: Never used  Substance Use Topics   Alcohol use: No   Drug use: No    Allergies:  Allergies  Allergen Reactions   Codeine    Penicillins Itching and Rash    Mother and patient state that any "-cillins" will cause these reactions. Has patient had a PCN reaction causing immediate rash, facial/tongue/throat swelling, SOB or lightheadedness with hypotension: Yes Has patient had a PCN reaction causing severe rash involving mucus membranes or skin necrosis: No Has patient  had a PCN reaction that required hospitalization No Has patient had a PCN reaction occurring within the last 10 years: Yes If all of the above answers are "NO", then may proceed with Cephal    Medications Prior to Admission  Medication Sig Dispense Refill Last Dose   aspirin 81 MG EC tablet Take 1 tablet (81 mg total) by mouth daily. Swallow whole. 90 tablet 3 09/28/2020   ferrous sulfate 325 (65 FE) MG tablet Take 1 tablet (325 mg total) by mouth every other day. 45 tablet 2 09/28/2020   Prenatal Vit-Fe Fumarate-FA (PRENATAL VITAMIN PO) Take by mouth daily.   09/28/2020   NIFEdipine (PROCARDIA) 10 MG capsule Take 1 capsule (10 mg total) by mouth every 4 (four) hours as needed. (Patient not taking: Reported on 09/26/2020) 30 capsule 1     Review of Systems  Gastrointestinal:  Positive for abdominal pain.  Genitourinary:  Positive for pelvic pain.  Musculoskeletal:  Positive for back pain.  All other systems reviewed and are negative. Physical Exam   Blood pressure 120/69, pulse (!) 103, temperature 98.2  F (36.8 C), temperature source Oral, resp. rate 18, height 5\' 9"  (1.753 m), weight 104.3 kg, last menstrual period 01/10/2020, SpO2 100 %.  Physical Exam Vitals and nursing note reviewed. Exam conducted with a chaperone present.  Constitutional:      Appearance: Normal appearance.  Cardiovascular:     Rate and Rhythm: Normal rate and regular rhythm.     Pulses: Normal pulses.     Heart sounds: Normal heart sounds.  Pulmonary:     Effort: Pulmonary effort is normal.     Breath sounds: Normal breath sounds.  Abdominal:     Comments: Gravid  Skin:    Capillary Refill: Capillary refill takes less than 2 seconds.  Neurological:     Mental Status: She is alert and oriented to person, place, and time.  Psychiatric:        Mood and Affect: Mood normal.        Behavior: Behavior normal.        Thought Content: Thought content normal.        Judgment: Judgment normal.    MAU Course   Procedures  --Borderline elevated bp noted during prenatal care. Elevated initial bp in MAU. PEC labs collected.   --EMR lists Hepatitis and Enlarged Liver on Problem List. Patient unaware of these diagnoses and states she doesn't remember how they came to be on her chart. Abnormal liver enzymes visible in Care Everywhere at Pediatric facility in [place] [facility]  --Reactive tracing: baseline 125, mod var, + accels, no decels  --Toco: Irregular contractions q 2-8 min. Pt sleeping through contractions during final hour of her evaluation. Pt declines recheck of cervix  Patient Vitals for the past 24 hrs:  BP Temp Temp src Pulse Resp SpO2 Height Weight  09/29/20 0431 120/69 -- -- (!) 103 18 -- -- --  09/29/20 0418 110/65 -- -- 97 -- -- -- --  09/29/20 0405 121/67 -- -- (!) 103 17 -- -- --  09/29/20 0347 110/70 -- -- 97 20 -- -- --  09/29/20 0345 -- -- -- -- -- 100 % -- --  09/29/20 0340 -- -- -- -- -- 100 % -- --  09/29/20 0339 -- -- -- -- -- 100 % -- --  09/29/20 0336 138/82 -- -- 100 -- -- -- --  09/29/20 0327 (!) 141/83 98.2 F (36.8 C) Oral 100 18 100 % 5\' 9"  (1.753 m) 104.3 kg  09/29/20 0325 -- -- -- -- -- 99 % -- --  09/29/20 0320 (!) 141/83 -- -- (!) 104 -- 99 % -- --   Results for orders placed or performed during the hospital encounter of 09/29/20 (from the past 24 hour(s))  Urinalysis, Routine w reflex microscopic Urine, Clean Catch     Status: Abnormal   Collection Time: 09/29/20  3:21 AM  Result Value Ref Range   Color, Urine YELLOW YELLOW   APPearance HAZY (A) CLEAR   Specific Gravity, Urine 1.010 1.005 - 1.030   pH 6.0 5.0 - 8.0   Glucose, UA NEGATIVE NEGATIVE mg/dL   Hgb urine dipstick NEGATIVE NEGATIVE   Bilirubin Urine NEGATIVE NEGATIVE   Ketones, ur 5 (A) NEGATIVE mg/dL   Protein, ur NEGATIVE NEGATIVE mg/dL   Nitrite NEGATIVE NEGATIVE   Leukocytes,Ua MODERATE (A) NEGATIVE   RBC / HPF 0-5 0 - 5 RBC/hpf   WBC, UA 21-50 0 - 5 WBC/hpf   Bacteria, UA MANY (A) NONE  SEEN   Squamous Epithelial / LPF 6-10 0 - 5  Mucus PRESENT   Protein / creatinine ratio, urine     Status: None   Collection Time: 09/29/20  3:47 AM  Result Value Ref Range   Creatinine, Urine 78.99 mg/dL   Total Protein, Urine 7 mg/dL   Protein Creatinine Ratio 0.09 0.00 - 0.15 mg/mg[Cre]  CBC     Status: Abnormal   Collection Time: 09/29/20  4:24 AM  Result Value Ref Range   WBC 7.5 4.0 - 10.5 K/uL   RBC 3.54 (L) 3.87 - 5.11 MIL/uL   Hemoglobin 9.7 (L) 12.0 - 15.0 g/dL   HCT 00.1 (L) 74.9 - 44.9 %   MCV 84.2 80.0 - 100.0 fL   MCH 27.4 26.0 - 34.0 pg   MCHC 32.6 30.0 - 36.0 g/dL   RDW 67.5 91.6 - 38.4 %   Platelets 345 150 - 400 K/uL   nRBC 0.0 0.0 - 0.2 %  Comprehensive metabolic panel     Status: Abnormal   Collection Time: 09/29/20  4:24 AM  Result Value Ref Range   Sodium 136 135 - 145 mmol/L   Potassium 3.4 (L) 3.5 - 5.1 mmol/L   Chloride 105 98 - 111 mmol/L   CO2 22 22 - 32 mmol/L   Glucose, Bld 95 70 - 99 mg/dL   BUN 7 6 - 20 mg/dL   Creatinine, Ser 6.65 0.44 - 1.00 mg/dL   Calcium 9.3 8.9 - 99.3 mg/dL   Total Protein 6.4 (L) 6.5 - 8.1 g/dL   Albumin 2.9 (L) 3.5 - 5.0 g/dL   AST 47 (H) 15 - 41 U/L   ALT 80 (H) 0 - 44 U/L   Alkaline Phosphatase 165 (H) 38 - 126 U/L   Total Bilirubin 0.7 0.3 - 1.2 mg/dL   GFR, Estimated >57 >01 mL/min   Anion gap 9 5 - 15   Meds ordered this encounter  Medications   acetaminophen (TYLENOL) tablet 1,000 mg   cyclobenzaprine (FLEXERIL) tablet 10 mg   Assessment and Plan  --20 y.o. G1P0000 at [redacted]w[redacted]d  --Reactive tracing --Cervix 3/60-70%/-3, unchanged from office --Elevated BP x 1 --Elevated liver enzymes, does not meet criteria for severe --Patient sleeping through contractions prior to discharge --S/p discussion with Dr. Charlotta Newton --Discharge home in stable condition with strict PEC precautions  F/U: --Message sent to Candie Chroman to please coordinate bp check and reaccomplish PEC labs on 08/22 --Clinic orders placed  accordingly  Calvert Cantor, CNM 09/29/2020, 6:38 AM

## 2020-10-03 ENCOUNTER — Ambulatory Visit (INDEPENDENT_AMBULATORY_CARE_PROVIDER_SITE_OTHER): Payer: Managed Care, Other (non HMO) | Admitting: Advanced Practice Midwife

## 2020-10-03 ENCOUNTER — Encounter: Payer: Self-pay | Admitting: Advanced Practice Midwife

## 2020-10-03 ENCOUNTER — Other Ambulatory Visit: Payer: Self-pay

## 2020-10-03 VITALS — BP 132/86 | HR 97 | Wt 230.0 lb

## 2020-10-03 DIAGNOSIS — Z3A38 38 weeks gestation of pregnancy: Secondary | ICD-10-CM

## 2020-10-03 DIAGNOSIS — Z3403 Encounter for supervision of normal first pregnancy, third trimester: Secondary | ICD-10-CM

## 2020-10-03 NOTE — Patient Instructions (Signed)

## 2020-10-03 NOTE — Progress Notes (Signed)
   LOW-RISK PREGNANCY VISIT Patient name: Alisha Norman MRN [id number]  Date of birth: [birth year] Chief Complaint:   Routine Prenatal Visit (Blood pressure check/ repeat PEC)  History of Present Illness:   Alisha Norman is a 20 y.o. G61P0000 female at [redacted]w[redacted]d with an Estimated Date of Delivery: 10/16/20 being seen today for ongoing management of a low-risk pregnancy.  Today she reports no complaints. Contractions: Irregular.  .  Movement: Present. denies leaking of fluid. Review of Systems:   Pertinent items are noted in HPI Denies abnormal vaginal discharge w/ itching/odor/irritation, headaches, visual changes, shortness of breath, chest pain, abdominal pain, severe nausea/vomiting, or problems with urination or bowel movements unless otherwise stated above. Pertinent History Reviewed:  Reviewed past medical,surgical, social, obstetrical and family history.  Reviewed problem list, medications and allergies. Physical Assessment:   Vitals:   10/03/20 0927  BP: 132/86  Pulse: 97  Weight: 230 lb (104.3 kg)  Body mass index is 33.97 kg/m.        Physical Examination:   General appearance: Well appearing, and in no distress  Mental status: Alert, oriented to person, place, and time  Skin: Warm & dry  Cardiovascular: Normal heart rate noted  Respiratory: Normal respiratory effort, no distress  Abdomen: Soft, gravid, nontender  Pelvic: Cervical exam deferred         Extremities: Edema: Trace  Fetal Status: Fetal Heart Rate (bpm): 144 Fundal Height: 40 cm Movement: Present    Chaperone: n/a    No results found for this or any previous visit (from the past 24 hour(s)).  Assessment & Plan:  1) Low-risk pregnancy G1P0000 at [redacted]w[redacted]d with an Estimated Date of Delivery: 10/16/20   2) BP elevated X1 in MAU:  recheck preE labs per S. Weinhold.  Will strip membranes > 39 weeks   Meds: No orders of the defined types were placed in this encounter.  Labs/procedures today: CBC, CMP, pr/cr  ratio  Plan:  Continue routine obstetrical care  Next visit: prefers in person    Reviewed: Term labor symptoms and general obstetric precautions including but not limited to vaginal bleeding, contractions, leaking of fluid and fetal movement were reviewed in detail with the patient.  All questions were answered. Has home bp cuff.. Check bp daily, let us know if >140/90.   Follow-up: Return in about 1 week (around 10/10/2020) for LROB.  No orders of the defined types were placed in this encounter.  Jacklyn Shell DNP, CNM 10/03/2020 10:00 AM

## 2020-10-04 ENCOUNTER — Inpatient Hospital Stay (HOSPITAL_COMMUNITY)
Admission: AD | Admit: 2020-10-04 | Discharge: 2020-10-07 | DRG: 806 | Disposition: A | Discharge status: Home / Self Care | Payer: Managed Care, Other (non HMO) | Attending: Obstetrics and Gynecology | Admitting: Obstetrics and Gynecology

## 2020-10-04 ENCOUNTER — Other Ambulatory Visit: Payer: Self-pay

## 2020-10-04 ENCOUNTER — Inpatient Hospital Stay (HOSPITAL_COMMUNITY): Payer: Managed Care, Other (non HMO) | Admitting: Anesthesiology

## 2020-10-04 ENCOUNTER — Encounter (HOSPITAL_COMMUNITY): Payer: Self-pay | Admitting: Obstetrics & Gynecology

## 2020-10-04 DIAGNOSIS — O4202 Full-term premature rupture of membranes, onset of labor within 24 hours of rupture: Secondary | ICD-10-CM | POA: Diagnosis not present

## 2020-10-04 DIAGNOSIS — Z2839 Other underimmunization status: Secondary | ICD-10-CM

## 2020-10-04 DIAGNOSIS — O99013 Anemia complicating pregnancy, third trimester: Secondary | ICD-10-CM

## 2020-10-04 DIAGNOSIS — Z3403 Encounter for supervision of normal first pregnancy, third trimester: Secondary | ICD-10-CM

## 2020-10-04 DIAGNOSIS — O4292 Full-term premature rupture of membranes, unspecified as to length of time between rupture and onset of labor: Principal | ICD-10-CM | POA: Diagnosis present

## 2020-10-04 DIAGNOSIS — Z880 Allergy status to penicillin: Secondary | ICD-10-CM | POA: Diagnosis not present

## 2020-10-04 DIAGNOSIS — O134 Gestational [pregnancy-induced] hypertension without significant proteinuria, complicating childbirth: Secondary | ICD-10-CM | POA: Diagnosis present

## 2020-10-04 DIAGNOSIS — Z20822 Contact with and (suspected) exposure to covid-19: Secondary | ICD-10-CM | POA: Diagnosis present

## 2020-10-04 DIAGNOSIS — Z23 Encounter for immunization: Secondary | ICD-10-CM

## 2020-10-04 DIAGNOSIS — D62 Acute posthemorrhagic anemia: Secondary | ICD-10-CM | POA: Diagnosis not present

## 2020-10-04 DIAGNOSIS — O9081 Anemia of the puerperium: Secondary | ICD-10-CM | POA: Diagnosis not present

## 2020-10-04 DIAGNOSIS — O139 Gestational [pregnancy-induced] hypertension without significant proteinuria, unspecified trimester: Secondary | ICD-10-CM

## 2020-10-04 DIAGNOSIS — O42919 Preterm premature rupture of membranes, unspecified as to length of time between rupture and onset of labor, unspecified trimester: Secondary | ICD-10-CM | POA: Diagnosis present

## 2020-10-04 DIAGNOSIS — Z3A38 38 weeks gestation of pregnancy: Secondary | ICD-10-CM

## 2020-10-04 DIAGNOSIS — R7401 Elevation of levels of liver transaminase levels: Secondary | ICD-10-CM | POA: Diagnosis not present

## 2020-10-04 LAB — CBC
HCT: 33.8 % — ABNORMAL LOW (ref 36.0–46.0)
Hematocrit: 30.6 % — ABNORMAL LOW (ref 34.0–46.6)
Hemoglobin: 10.2 g/dL — ABNORMAL LOW (ref 11.1–15.9)
Hemoglobin: 10.8 g/dL — ABNORMAL LOW (ref 12.0–15.0)
MCH: 27 pg (ref 26.6–33.0)
MCH: 27.3 pg (ref 26.0–34.0)
MCHC: 33.3 g/dL (ref 31.5–35.7)
MCHC: 32 g/dL (ref 30.0–36.0)
MCV: 81 fL (ref 79–97)
MCV: 85.6 fL (ref 80.0–100.0)
Platelets: 339 10*3/uL (ref 150–450)
Platelets: 355 10*3/uL (ref 150–400)
RBC: 3.78 x10E6/uL (ref 3.77–5.28)
RBC: 3.95 MIL/uL (ref 3.87–5.11)
RDW: 12.2 % (ref 11.7–15.4)
RDW: 13.2 % (ref 11.5–15.5)
WBC: 5.7 10*3/uL (ref 3.4–10.8)
WBC: 6 10*3/uL (ref 4.0–10.5)
nRBC: 0 % (ref 0.0–0.2)

## 2020-10-04 LAB — URINALYSIS, ROUTINE W REFLEX MICROSCOPIC
Bilirubin Urine: NEGATIVE
Glucose, UA: NEGATIVE mg/dL
Hgb urine dipstick: NEGATIVE
Ketones, ur: NEGATIVE mg/dL
Nitrite: NEGATIVE
Protein, ur: NEGATIVE mg/dL
Specific Gravity, Urine: 1.012 (ref 1.005–1.030)
pH: 7 (ref 5.0–8.0)

## 2020-10-04 LAB — COMPREHENSIVE METABOLIC PANEL
ALT: 55 IU/L — ABNORMAL HIGH (ref 0–32)
ALT: 63 U/L — ABNORMAL HIGH (ref 0–44)
AST: 27 IU/L (ref 0–40)
AST: 33 U/L (ref 15–41)
Albumin/Globulin Ratio: 1.5 (ref 1.2–2.2)
Albumin: 3.9 g/dL (ref 3.9–5.0)
Albumin: 3.1 g/dL — ABNORMAL LOW (ref 3.5–5.0)
Alkaline Phosphatase: 208 IU/L — ABNORMAL HIGH (ref 44–121)
Alkaline Phosphatase: 197 U/L — ABNORMAL HIGH (ref 38–126)
Anion gap: 9 (ref 5–15)
BUN/Creatinine Ratio: 11 (ref 9–23)
BUN: 8 mg/dL (ref 6–20)
BUN: 8 mg/dL (ref 6–20)
Bilirubin Total: 0.2 mg/dL (ref 0.0–1.2)
CO2: 17 mmol/L — ABNORMAL LOW (ref 20–29)
CO2: 21 mmol/L — ABNORMAL LOW (ref 22–32)
Calcium: 9.4 mg/dL (ref 8.7–10.2)
Calcium: 9.6 mg/dL (ref 8.9–10.3)
Chloride: 102 mmol/L (ref 96–106)
Chloride: 105 mmol/L (ref 98–111)
Creatinine, Ser: 0.72 mg/dL (ref 0.57–1.00)
Creatinine, Ser: 0.66 mg/dL (ref 0.44–1.00)
GFR, Estimated: >60 mL/min (ref >60)
Globulin, Total: 2.6 g/dL (ref 1.5–4.5)
Glucose, Bld: 70 mg/dL (ref 70–99)
Glucose: 93 mg/dL (ref 65–99)
Potassium: 3.6 mmol/L (ref 3.5–5.2)
Potassium: 3.4 mmol/L — ABNORMAL LOW (ref 3.5–5.1)
Sodium: 136 mmol/L (ref 134–144)
Sodium: 135 mmol/L (ref 135–145)
Total Bilirubin: 0.7 mg/dL (ref 0.3–1.2)
Total Protein: 6.5 g/dL (ref 6.0–8.5)
Total Protein: 7 g/dL (ref 6.5–8.1)
eGFR: 121 mL/min/{1.73_m2} (ref >59)

## 2020-10-04 LAB — RESP PANEL BY RT-PCR (FLU A&B, COVID) ARPGX2
Influenza A by PCR: NEGATIVE
Influenza B by PCR: NEGATIVE
SARS Coronavirus 2 by RT PCR: NEGATIVE

## 2020-10-04 LAB — PROTEIN / CREATININE RATIO, URINE
Creatinine, Urine: 96.1 mg/dL
Protein, Ur: 13.6 mg/dL
Protein/Creat Ratio: 142 mg/g creat (ref 0–200)

## 2020-10-04 LAB — TYPE AND SCREEN
ABO/RH(D): O POS
Antibody Screen: NEGATIVE

## 2020-10-04 LAB — POCT FERN TEST: POCT Fern Test: POSITIVE

## 2020-10-04 MED ORDER — FENTANYL-BUPIVACAINE-NACL 0.5-0.125-0.9 MG/250ML-% EP SOLN
12.0000 mL/h | EPIDURAL | Status: DC | PRN
Start: 2020-10-04 — End: 2020-10-05
  Administered 2020-10-04: 12 mL/h via EPIDURAL
  Filled 2020-10-04: qty 250

## 2020-10-04 MED ORDER — PHENYLEPHRINE 40 MCG/ML (10ML) SYRINGE FOR IV PUSH (FOR BLOOD PRESSURE SUPPORT): 80.0000 ug | PREFILLED_SYRINGE | INTRAVENOUS | Status: DC | PRN

## 2020-10-04 MED ORDER — LACTATED RINGERS IV SOLN: INTRAVENOUS | Status: DC

## 2020-10-04 MED ORDER — ONDANSETRON HCL 4 MG/2ML IJ SOLN: 4.0000 mg | Freq: Four times a day (QID) | INTRAMUSCULAR | Status: DC | PRN

## 2020-10-04 MED ORDER — LACTATED RINGERS IV SOLN
500.0000 mL | INTRAVENOUS | Status: DC | PRN
  Administered 2020-10-04: 500 mL via INTRAVENOUS

## 2020-10-04 MED ORDER — LIDOCAINE HCL (PF) 1 % IJ SOLN
30.0000 mL | INTRAMUSCULAR | Status: AC | PRN
Start: 2020-10-04 — End: 2020-10-05
  Administered 2020-10-05: 30 mL via SUBCUTANEOUS
  Filled 2020-10-04: qty 30

## 2020-10-04 MED ORDER — OXYTOCIN BOLUS FROM INFUSION
333.0000 mL | Freq: Once | INTRAVENOUS | Status: AC
  Administered 2020-10-05: 333 mL via INTRAVENOUS

## 2020-10-04 MED ORDER — LACTATED RINGERS IV SOLN: 500.0000 mL | Freq: Once | INTRAVENOUS | Status: DC

## 2020-10-04 MED ORDER — DIPHENHYDRAMINE HCL 50 MG/ML IJ SOLN: 12.5000 mg | INTRAMUSCULAR | Status: DC | PRN

## 2020-10-04 MED ORDER — OXYTOCIN-SODIUM CHLORIDE 30-0.9 UT/500ML-% IV SOLN
2.5000 [IU]/h | INTRAVENOUS | Status: DC
  Filled 2020-10-04: qty 500

## 2020-10-04 MED ORDER — ACETAMINOPHEN 325 MG PO TABS: 650.0000 mg | ORAL_TABLET | ORAL | Status: DC | PRN

## 2020-10-04 MED ORDER — LACTATED RINGERS IV SOLN
500.0000 mL | Freq: Once | INTRAVENOUS | Status: AC
  Administered 2020-10-04: 500 mL via INTRAVENOUS

## 2020-10-04 MED ORDER — SOD CITRATE-CITRIC ACID 500-334 MG/5ML PO SOLN: 30.0000 mL | ORAL | Status: DC | PRN

## 2020-10-04 MED ORDER — EPHEDRINE 5 MG/ML INJ: 10.0000 mg | INTRAVENOUS | Status: DC | PRN

## 2020-10-04 MED ORDER — LIDOCAINE HCL (PF) 1 % IJ SOLN
INTRAMUSCULAR | Status: DC | PRN
  Administered 2020-10-04: 8 mL via EPIDURAL

## 2020-10-04 NOTE — Anesthesia Procedure Notes (Signed)
Epidural Patient location during procedure: OB Start time: 10/04/2020 7:56 PM End time: 10/04/2020 8:15 PM  Staffing Anesthesiologist: Achille Rich, MD Performed: anesthesiologist   Preanesthetic Checklist Completed: patient identified, IV checked, site marked, risks and benefits discussed, monitors and equipment checked, pre-op evaluation and timeout performed  Epidural Patient position: sitting Prep: DuraPrep Patient monitoring: heart rate, cardiac monitor, continuous pulse ox and blood pressure Approach: midline Location: L2-L3 Injection technique: LOR saline  Needle:  Needle type: Tuohy  Needle gauge: 17 G Needle length: 9 cm Needle insertion depth: 6 cm Catheter type: closed end flexible Catheter size: 19 Gauge Catheter at skin depth: 9 cm Test dose: negative and Other  Assessment Events: blood not aspirated, injection not painful, no injection resistance and negative IV test  Additional Notes Informed consent obtained prior to proceeding including risk of failure, 1% risk of PDPH, risk of minor discomfort and bruising.  Discussed rare but serious complications including epidural abscess, permanent nerve injury, epidural hematoma.  Discussed alternatives to epidural analgesia and patient desires to proceed.  Timeout performed pre-procedure verifying patient name, procedure, and platelet count.  Patient tolerated procedure well. Reason for block:procedure for pain

## 2020-10-04 NOTE — H&P (Signed)
OBSTETRIC ADMISSION HISTORY AND PHYSICAL  Alisha Norman is a 20 y.o. female G1P0000 with IUP at [redacted]w[redacted]d presenting for PROM at term. She reports +FMs. No LOF, VB, blurry vision, headaches, peripheral edema, or RUQ pain. She plans on breastfeeding. She requests Nexplanon for birth control.  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 - Anemia - Rubella non-immune  Past Medical History: Past Medical History:  Diagnosis Date   Amnesia    Concussion    Dermatitis    Enlarged liver    Headache    Hepatitis     Past Surgical History: Past Surgical History:  Procedure Laterality Date   NO PAST SURGERIES      Obstetrical History: OB History     Gravida  1   Para  0   Term  0   Preterm  0   AB  0   Living  0      SAB  0   IAB  0   Ectopic  0   Multiple  0   Live Births  0           Social History: Social History   Socioeconomic History   Marital status: Single    Spouse name: Not on file   Number of children: Not on file   Years of education: Not on file   Highest education level: Not on file  Occupational History   Not on file  Tobacco Use   Smoking status: Never   Smokeless tobacco: Never  Vaping Use   Vaping Use: Never used  Substance and Sexual Activity   Alcohol use: No   Drug use: No   Sexual activity: Not Currently    Birth control/protection: None  Other Topics Concern   Not on file  Social History Narrative   Not on file   Social Determinants of Health   Financial Resource Strain: Low Risk    Difficulty of Paying Living Expenses: Not hard at all  Food Insecurity: No Food Insecurity   Worried About in the Last Year: Never true   Ran Out of Food in the Last Year: Never true  Transportation Needs: No Transportation Needs   Lack of Transportation (Medical): No   Lack of  Transportation (Non-Medical): No  Physical Activity: Insufficiently Active   Days of Exercise per Week: 7 days   Minutes of Exercise per Session: 10 min  Stress: No Stress Concern Present   Feeling of Stress : Only a little  Social Connections: Moderately Integrated   Frequency of Communication with Friends and Family: More than three times a week   Frequency of Social Gatherings with Friends and Family: Once a week   Attends Religious Services: 1 to 4 times per year   Active Member of Programme researcher, broadcasting/film/video or Organizations: No   Attends [employer] Meetings: Never   Marital Status: Living with partner    Family History: Family History  Problem Relation Age of Onset   Heart attack Paternal Grandfather    Diabetes Maternal Grandmother     Allergies: Allergies  Allergen Reactions   Codeine    Penicillins Itching and Rash    Mother and patient state that any "-cillins" will cause these reactions. Has patient had a PCN reaction causing immediate rash, facial/tongue/throat swelling, SOB or lightheadedness with hypotension: Yes Has patient had a  PCN reaction causing severe rash involving mucus membranes or skin necrosis: No Has patient had a PCN reaction that required hospitalization No Has patient had a PCN reaction occurring within the last 10 years: Yes If all of the above answers are "NO", then may proceed with Cephal    Medications Prior to Admission  Medication Sig Dispense Refill Last Dose   aspirin 81 MG EC tablet Take 1 tablet (81 mg total) by mouth daily. Swallow whole. 90 tablet 3 10/04/2020   ferrous sulfate 325 (65 FE) MG tablet Take 1 tablet (325 mg total) by mouth every other day. 45 tablet 2 10/03/2020   Prenatal Vit-Fe Fumarate-FA (PRENATAL VITAMIN PO) Take by mouth daily.   10/04/2020   Review of Systems:  All systems reviewed and negative except as stated in HPI  PE: Blood pressure 140/90, pulse (!) 116, temperature 97.9 F (36.6 C), temperature source Oral, resp.  rate 20, height 5\' 9"  (1.753 m), weight 103.9 kg, last menstrual period 01/10/2020, SpO2 99 %. General appearance: alert, cooperative, and no distress Lungs: regular rate and effort Heart: regular rate  Abdomen: soft, non-tender Extremities: Homans sign is negative, no sign of DVT Presentation: cephalic EFM: 140 bpm, mod variability, + accels, no decels Toco: 1.5-4 SVE: 3/80/-2  Prenatal labs: ABO, Rh: O/Positive/-- (03/16 1547) Antibody: Negative (06/07 0814) Rubella: <0.90 (03/16 1547) RPR: Non Reactive (06/07 0814)  HBsAg: Negative (03/16 1547)  HIV: Non Reactive (06/07 0814)  GBS: --04-[birth year]1996 (08/09 1106)  2 hr GTT normal  Prenatal Transfer Tool  Maternal Diabetes: No Genetic Screening: Abnormal:  Results: Other: alpha thal carrier Maternal Ultrasounds/Referrals: Normal Fetal Ultrasounds or other Referrals:  Referred to Materal Fetal Medicine  Maternal Substance Abuse:  No Significant Maternal Medications:  None Significant Maternal Lab Results: Group B Strep negative  No results found for this or any previous visit (from the past 24 hour(s)).  Patient Active Problem List   Diagnosis Date Noted   Preterm labor 08/23/2020   Abnormal chromosomal and genetic finding on antenatal screening mother 05/09/2020   Rubella non-immune status, antepartum 04/30/2020   Anemia in pregnancy 04/30/2020   Supervision of normal first pregnancy 04/04/2020    Assessment: Alisha Norman is a 20 y.o. G1P0000 at [redacted]w[redacted]d here for PROM at term  1. Labor: latent 2. FWB: Cat I 3. Pain: analgesia, anesthesia, water immerion prn 4. GBS: neg 5. Elevated BP  Plan: Admit to LD PEC labs Expectant mngt Informed if BP remains elevated, that would exclude her from waterbirth  [redacted]w[redacted]d, CNM  10/04/2020, 4:09 PM

## 2020-10-04 NOTE — MAU Note (Signed)
Presents with c/o LOF since 1400 this afternoon, reports fluid clear.  Denies VB.  Endorses +FM. °

## 2020-10-04 NOTE — Progress Notes (Addendum)
Labor Progress Note Alisha Norman is a 20 y.o. G1P0000 at [redacted]w[redacted]d presented for early labor and SROM  S:  Feeling more pain with ctx.   O:  BP 131/84   Pulse (!) 101   Temp 97.9 F (36.6 C) (Oral)   Resp 20   Ht 5\' 9"  (1.753 m)   Wt 103.9 kg   LMP 01/10/2020   SpO2 99%   BMI 33.82 kg/m  EFM: baseline 135 bpm/ mod variability/ + accels/ no decels  Toco/IUPC: undeterminable SVE: Dilation: 5 Effacement (%): 90 Cervical Position: Middle Station: -1 Presentation: Vertex Exam by:: [email or web address], CNM   A/P: 20 y.o. G1P0000 [redacted]w[redacted]d  1. Labor: early 2. FWB: Cat I 3. Pain: analgesia/anesthesia/NO prn 4. gHTN: meets criteria  PEC labs pending. Now excluded from WB d/t BP. Anticipate labor progress and SVD.  [redacted]w[redacted]d, CNM 7:23 PM

## 2020-10-04 NOTE — Anesthesia Preprocedure Evaluation (Signed)
Anesthesia Evaluation  Patient identified by MRN, date of birth, ID band Patient awake    Reviewed: Allergy & Precautions, H&P , NPO status , Patient's Chart, lab work & pertinent test results  Airway Mallampati: I   Neck ROM: full    Dental   Pulmonary neg pulmonary ROS,    breath sounds clear to auscultation       Cardiovascular hypertension,  Rhythm:regular Rate:Normal     Neuro/Psych  Headaches,    GI/Hepatic (+) Hepatitis -  Endo/Other    Renal/GU      Musculoskeletal   Abdominal   Peds  Hematology  (+) Blood dyscrasia, anemia ,   Anesthesia Other Findings   Reproductive/Obstetrics (+) Pregnancy                             Anesthesia Physical Anesthesia Plan  ASA: 2  Anesthesia Plan: Epidural   Post-op Pain Management:    Induction: Intravenous  PONV Risk Score and Plan: 2 and Treatment may vary due to age or medical condition  Airway Management Planned: Natural Airway  Additional Equipment:   Intra-op Plan:   Post-operative Plan:   Informed Consent: I have reviewed the patients History and Physical, chart, labs and discussed the procedure including the risks, benefits and alternatives for the proposed anesthesia with the patient or authorized representative who has indicated his/her understanding and acceptance.     Dental advisory given  Plan Discussed with: Anesthesiologist  Anesthesia Plan Comments:         Anesthesia Quick Evaluation

## 2020-10-05 ENCOUNTER — Encounter (HOSPITAL_COMMUNITY): Payer: Self-pay | Admitting: Obstetrics and Gynecology

## 2020-10-05 DIAGNOSIS — O134 Gestational [pregnancy-induced] hypertension without significant proteinuria, complicating childbirth: Secondary | ICD-10-CM

## 2020-10-05 DIAGNOSIS — O4202 Full-term premature rupture of membranes, onset of labor within 24 hours of rupture: Secondary | ICD-10-CM

## 2020-10-05 DIAGNOSIS — Z3A38 38 weeks gestation of pregnancy: Secondary | ICD-10-CM

## 2020-10-05 LAB — CBC
HCT: 28 % — ABNORMAL LOW (ref 36.0–46.0)
Hemoglobin: 8.9 g/dL — ABNORMAL LOW (ref 12.0–15.0)
MCH: 27.3 pg (ref 26.0–34.0)
MCHC: 31.8 g/dL (ref 30.0–36.0)
MCV: 85.9 fL (ref 80.0–100.0)
Platelets: 313 10*3/uL (ref 150–400)
RBC: 3.26 MIL/uL — ABNORMAL LOW (ref 3.87–5.11)
RDW: 13.2 % (ref 11.5–15.5)
WBC: 12 10*3/uL — ABNORMAL HIGH (ref 4.0–10.5)
nRBC: 0 % (ref 0.0–0.2)

## 2020-10-05 LAB — RPR: RPR Ser Ql: NONREACTIVE

## 2020-10-05 MED ORDER — MISOPROSTOL 200 MCG PO TABS
ORAL_TABLET | ORAL | Status: AC
  Filled 2020-10-05: qty 4

## 2020-10-05 MED ORDER — SIMETHICONE 80 MG PO CHEW: 80.0000 mg | CHEWABLE_TABLET | ORAL | Status: DC | PRN

## 2020-10-05 MED ORDER — ONDANSETRON HCL 4 MG PO TABS: 4.0000 mg | ORAL_TABLET | ORAL | Status: DC | PRN

## 2020-10-05 MED ORDER — DOCUSATE SODIUM 100 MG PO CAPS
100.0000 mg | ORAL_CAPSULE | Freq: Two times a day (BID) | ORAL | Status: DC
Start: 2020-10-05 — End: 2020-10-07
  Administered 2020-10-05 – 2020-10-06 (×3): 100 mg via ORAL
  Filled 2020-10-05 (×3): qty 1

## 2020-10-05 MED ORDER — ACETAMINOPHEN 325 MG PO TABS
650.0000 mg | ORAL_TABLET | ORAL | Status: DC | PRN
Start: 2020-10-05 — End: 2020-10-06

## 2020-10-05 MED ORDER — DIBUCAINE (PERIANAL) 1 % EX OINT: 1.0000 "application " | TOPICAL_OINTMENT | CUTANEOUS | Status: DC | PRN

## 2020-10-05 MED ORDER — FLEET ENEMA 7-19 GM/118ML RE ENEM: 1.0000 | ENEMA | Freq: Every day | RECTAL | Status: DC | PRN

## 2020-10-05 MED ORDER — MEASLES, MUMPS & RUBELLA VAC IJ SOLR
0.5000 mL | Freq: Once | INTRAMUSCULAR | Status: AC
  Administered 2020-10-06: 0.5 mL via SUBCUTANEOUS
  Filled 2020-10-05: qty 0.5

## 2020-10-05 MED ORDER — FERROUS SULFATE 325 (65 FE) MG PO TABS
325.0000 mg | ORAL_TABLET | ORAL | Status: DC
  Administered 2020-10-05: 325 mg via ORAL
  Filled 2020-10-05: qty 1

## 2020-10-05 MED ORDER — BENZOCAINE-MENTHOL 20-0.5 % EX AERO
1.0000 "application " | INHALATION_SPRAY | CUTANEOUS | Status: DC | PRN
  Administered 2020-10-05: 1 via TOPICAL
  Filled 2020-10-05: qty 56

## 2020-10-05 MED ORDER — PRENATAL MULTIVITAMIN CH
1.0000 | ORAL_TABLET | Freq: Every day | ORAL | Status: DC
  Administered 2020-10-05 – 2020-10-06 (×2): 1 via ORAL
  Filled 2020-10-05 (×2): qty 1

## 2020-10-05 MED ORDER — ONDANSETRON HCL 4 MG/2ML IJ SOLN: 4.0000 mg | INTRAMUSCULAR | Status: DC | PRN

## 2020-10-05 MED ORDER — BISACODYL 10 MG RE SUPP: 10.0000 mg | Freq: Every day | RECTAL | Status: DC | PRN

## 2020-10-05 MED ORDER — METHYLERGONOVINE MALEATE 0.2 MG PO TABS: 0.2000 mg | ORAL_TABLET | ORAL | Status: DC | PRN

## 2020-10-05 MED ORDER — METHYLERGONOVINE MALEATE 0.2 MG/ML IJ SOLN: 0.2000 mg | INTRAMUSCULAR | Status: DC | PRN

## 2020-10-05 MED ORDER — TETANUS-DIPHTH-ACELL PERTUSSIS 5-2.5-18.5 LF-MCG/0.5 IM SUSY: 0.5000 mL | PREFILLED_SYRINGE | Freq: Once | INTRAMUSCULAR | Status: DC

## 2020-10-05 MED ORDER — DIPHENHYDRAMINE HCL 25 MG PO CAPS: 25.0000 mg | ORAL_CAPSULE | Freq: Four times a day (QID) | ORAL | Status: DC | PRN

## 2020-10-05 MED ORDER — IBUPROFEN 600 MG PO TABS
600.0000 mg | ORAL_TABLET | Freq: Four times a day (QID) | ORAL | Status: DC
  Administered 2020-10-05 – 2020-10-07 (×8): 600 mg via ORAL
  Filled 2020-10-05 (×8): qty 1

## 2020-10-05 MED ORDER — MEDROXYPROGESTERONE ACETATE 150 MG/ML IM SUSP: 150.0000 mg | INTRAMUSCULAR | Status: DC | PRN

## 2020-10-05 MED ORDER — MISOPROSTOL 200 MCG PO TABS
800.0000 ug | ORAL_TABLET | Freq: Once | ORAL | Status: AC
  Administered 2020-10-05: 800 ug via RECTAL

## 2020-10-05 MED ORDER — NIFEDIPINE ER OSMOTIC RELEASE 30 MG PO TB24
30.0000 mg | ORAL_TABLET | Freq: Every day | ORAL | Status: DC
  Administered 2020-10-05 – 2020-10-06 (×2): 30 mg via ORAL
  Filled 2020-10-05 (×2): qty 1

## 2020-10-05 MED ORDER — COCONUT OIL OIL
1.0000 "application " | TOPICAL_OIL | Status: DC | PRN
  Administered 2020-10-05: 1 via TOPICAL

## 2020-10-05 MED ORDER — WITCH HAZEL-GLYCERIN EX PADS: 1.0000 "application " | MEDICATED_PAD | CUTANEOUS | Status: DC | PRN

## 2020-10-05 NOTE — Lactation Note (Addendum)
This note was copied from a baby's chart. Lactation Consultation Note  Patient Name: Alisha Norman [id number] Date: 10/05/2020 Reason for consult: Initial assessment;Primapara;1st time breastfeeding;NICU baby;Early term 37-38.6wks;Other (Comment) (GHTN) Age:22 hours  Visited with mom of 10 hours old ETI NICU female, she's a P1 and reported (+) breast changes during the pregnancy. LC assisted with hand expression, colostrum was noted on the left breast but none on the right one. Note some areolar edema on the right breast, tissue was only semi-compressible.  Mom able to do teach back with hand expression. She already started pumping this morning, and getting drops, praised her for her efforts. Reviewed pumping schedule, pumping log and breastmilk storage guidelines.  Plan of care:  Encouraged mom to continue pumping consistently every 2-3 hours, at least 8 times/24 hours Breast massage, hand expression and coconut oil were also encouraged prior pumping  BF brochure, BF resources and NICU booklet were reviewed. FOB present. Parents reported all questions and concerns were answered, they're both aware of LC OP services and will call PRN.  Maternal Data Has patient been taught Hand Expression?: Yes  Feeding Mother's Current Feeding Choice: Breast Milk  Lactation Tools Discussed/Used Tools: Pump;Flanges Flange Size: 24;27 Breast pump type: Double-Electric Breast Pump Pump Education: Setup, frequency, and cleaning;Milk Storage Reason for Pumping: ETI in NICU Pumping frequency: q 3 hours (recommended) Pumped volume:  (drops)  Interventions Interventions: Breast feeding basics reviewed;DEBP;Education;Hand express;Breast massage  Discharge Pump: DEBP;Personal (Medela Pump & Style at home) [facility] Program: Yes  Consult Status Consult Status: Follow-up Follow-up type: In-patient    Kermitt Harjo [name] 10/05/2020, 1:57 PM

## 2020-10-05 NOTE — Anesthesia Postprocedure Evaluation (Signed)
Anesthesia Post Note  Patient: Alisha Norman  Procedure(s) Performed: AN AD HOC LABOR EPIDURAL     Patient location during evaluation: Mother Baby Anesthesia Type: Epidural Level of consciousness: awake and alert Pain management: pain level controlled Vital Signs Assessment: post-procedure vital signs reviewed and stable Respiratory status: spontaneous breathing, nonlabored ventilation and respiratory function stable Cardiovascular status: stable Postop Assessment: no headache, no backache and epidural receding Anesthetic complications: no   No notable events documented.  Last Vitals:  Vitals:   10/05/20 0842 10/05/20 1159  BP: 134/85 133/77  Pulse: (!) 113 (!) 109  Resp: 16 18  Temp: 36.8 C 36.7 C  SpO2: 99% 99%    Last Pain:  Vitals:   10/05/20 1623  TempSrc:   PainSc: 4    Pain Goal: Patients Stated Pain Goal: 3 (10/05/20 0730)                 [facility] Records

## 2020-10-05 NOTE — Progress Notes (Signed)
Patient Vitals for the past 4 hrs:  BP Temp Temp src Pulse SpO2  10/05/20 0101 (!) 147/87 -- -- 97 --  10/05/20 0038 -- 98.5 F (36.9 C) Oral -- --  10/05/20 0001 135/83 -- -- 97 --  10/04/20 2331 117/69 -- -- 97 --  10/04/20 2300 117/80 98.4 F (36.9 C) Oral 78 --  10/04/20 2231 119/63 -- -- 80 --  10/04/20 2201 (!) 145/80 -- -- 98 --  10/04/20 2138 -- -- -- -- 100 %  10/04/20 2135 -- -- -- -- 100 %  10/04/20 2131 (!) 141/73 -- -- 91 --  10/04/20 2130 -- -- -- -- 100 %   Feeling lots of rectal pressure. FHR Cat 1.  Ctx q 2 min utes.  Cx 9/100/0. Pt trying not to push, will push as soon as cx is 10 cms.

## 2020-10-05 NOTE — Discharge Summary (Addendum)
Postpartum Discharge Summary  Date of Service updated     Patient Name: Alisha Norman DOB: [birth year] MRN: [id number]  Date of admission: 10/04/2020 Delivery date:10/05/2020  Delivering provider: Christin Fudge  Date of discharge: 10/07/2020  Admitting diagnosis: Preterm premature rupture of membranes [O42.919] Intrauterine pregnancy: [redacted]w[redacted]d    Secondary diagnosis:  Active Problems:   Preterm premature rupture of membranes   Gestational hypertension  Additional problems: none    Discharge diagnosis: Term Pregnancy Delivered                                              Post partum procedures: none Augmentation: Pitocin Complications: None  Hospital course: Onset of Labor With Vaginal Delivery      20y.o. yo G1P0000 at 330w3das admitted in Latent Labor on 10/04/2020. Patient had an uncomplicated labor course as follows:  Membrane Rupture Time/Date: 2:00 PM ,10/04/2020   Delivery Method:Vaginal, Spontaneous  Episiotomy: None  Lacerations:  2nd degree  Patient had an uncomplicated postpartum course.  She is ambulating, tolerating a regular diet, passing flatus, and urinating well. Patient is discharged home in stable condition on 8/28. Her blood pressures were well controlled on Procardia. Her ALT had improved and was trending downward. She was noted to have postpartum blood loss anemia and anemia of pregnancy for which she was instructed to continue her Iron.   Newborn Data: Birth date:10/05/2020  Birth time:3:30 AM  Gender:Female  Living status:Living  Apgars:5 ,6  Weight:   Magnesium Sulfate received: No BMZ received: Yes Rhophylac:N/A MMR:N/A T-DaP:Given prenatally Flu: N/A Transfusion:No  Physical exam  Vitals:   10/05/20 0245 10/05/20 0250 10/05/20 0255 10/05/20 0304  BP:    (!) 106/93  Pulse:    (!) 112  Resp:      Temp:      TempSrc:      SpO2: 100% 99% 100%   Weight:      Height:       General: alert, cooperative, and no distress Lochia:  appropriate Uterine Fundus: firm Incision: N/A DVT Evaluation: No evidence of DVT seen on physical exam. No cords or calf tenderness. Labs: Lab Results  Component Value Date   WBC 6.0 10/04/2020   HGB 10.8 (L) 10/04/2020   HCT 33.8 (L) 10/04/2020   MCV 85.6 10/04/2020   PLT 355 10/04/2020   CMP Latest Ref Rng & Units 10/04/2020  Glucose 70 - 99 mg/dL 70  BUN 6 - 20 mg/dL 8  Creatinine 0.44 - 1.00 mg/dL 0.66  Sodium 135 - 145 mmol/L 135  Potassium 3.5 - 5.1 mmol/L 3.4(L)  Chloride 98 - 111 mmol/L 105  CO2 22 - 32 mmol/L 21(L)  Calcium 8.9 - 10.3 mg/dL 9.6  Total Protein 6.5 - 8.1 g/dL 7.0  Total Bilirubin 0.3 - 1.2 mg/dL 0.7  Alkaline Phos 38 - 126 U/L 197(H)  AST 15 - 41 U/L 33  ALT 0 - 44 U/L 63(H)   EdFlavia Shippercore: No flowsheet data found.   After visit meds:  Allergies as of 10/07/2020       Reactions   Codeine Itching   Penicillins Itching, Rash   Mother and patient state that any "-cillins" will cause these reactions. Has patient had a PCN reaction causing immediate rash, facial/tongue/throat swelling, SOB or lightheadedness with hypotension: Yes Has patient had a PCN reaction causing  severe rash involving mucus membranes or skin necrosis: No Has patient had a PCN reaction that required hospitalization No Has patient had a PCN reaction occurring within the last 10 years: Yes If all of the above answers are "NO", then may proceed with Cephal        Medication List     TAKE these medications    aspirin 81 MG EC tablet Take 1 tablet (81 mg total) by mouth daily. Swallow whole.   ferrous sulfate 325 (65 FE) MG tablet Take 1 tablet (325 mg total) by mouth every other day.   ibuprofen 600 MG tablet Commonly known as: ADVIL Take 1 tablet (600 mg total) by mouth every 6 (six) hours.   NIFEdipine 30 MG 24 hr tablet Commonly known as: ADALAT CC Take 1 tablet (30 mg total) by mouth daily.   prenatal multivitamin Tabs tablet Take 1 tablet by mouth  daily. What changed: how much to take         Discharge home in stable condition Infant Feeding: Breast and pumping Infant Disposition:NICU Discharge instruction: per After Visit Summary and Postpartum booklet. Activity: Advance as tolerated. Pelvic rest for 6 weeks.  Diet: routine diet Future Appointments: Future Appointments  Date Time Provider [place]  10/10/2020 10:10 AM Myrtis Ser, CNM [facility]   Follow up Visit:   Please schedule this patient for a In person postpartum visit in 4 weeks with the following provider: Any provider.  Additional Postpartum F/U: 1 week for BP check. Note sent to the office.  Low risk pregnancy complicated by:  Delivery mode:  Vaginal, Spontaneous  Anticipated Birth Control:  Nexplanon   10/07/20 Radene Gunning, MD

## 2020-10-05 NOTE — Plan of Care (Signed)

## 2020-10-06 DIAGNOSIS — R7401 Elevation of levels of liver transaminase levels: Secondary | ICD-10-CM | POA: Diagnosis not present

## 2020-10-06 LAB — COMPREHENSIVE METABOLIC PANEL
ALT: 77 U/L — ABNORMAL HIGH (ref 0–44)
AST: 43 U/L — ABNORMAL HIGH (ref 15–41)
Albumin: 2.6 g/dL — ABNORMAL LOW (ref 3.5–5.0)
Alkaline Phosphatase: 136 U/L — ABNORMAL HIGH (ref 38–126)
Anion gap: 8 (ref 5–15)
BUN: 8 mg/dL (ref 6–20)
CO2: 24 mmol/L (ref 22–32)
Calcium: 8.8 mg/dL — ABNORMAL LOW (ref 8.9–10.3)
Chloride: 107 mmol/L (ref 98–111)
Creatinine, Ser: 0.65 mg/dL (ref 0.44–1.00)
GFR, Estimated: >60 mL/min (ref >60)
Glucose, Bld: 91 mg/dL (ref 70–99)
Potassium: 3.6 mmol/L (ref 3.5–5.1)
Sodium: 139 mmol/L (ref 135–145)
Total Bilirubin: 0.6 mg/dL (ref 0.3–1.2)
Total Protein: 5.8 g/dL — ABNORMAL LOW (ref 6.5–8.1)

## 2020-10-06 NOTE — Lactation Note (Signed)
This note was copied from a baby's chart. Lactation Consultation Note  Patient Name: Alisha Norman HOOIL'N Date: 10/06/2020 Reason for consult: Primapara;1st time breastfeeding;Follow-up assessment;NICU baby;Early term 37-38.6wks;Other (Comment) (GHTN) Age:22 hours  Attempted to visit with mom for the 5 pm feeding but she hasn't arrived to the NICU yet. Asked NICU RN to print out labels for mom, mom already getting small volumes of colostrum. NICU RN to call lactation when mom arrives or to schedule another feeding assist for tomorrow.  Feeding Mother's Current Feeding Choice: Breast Milk and Donor Milk Nipple Type: Nfant Slow Flow (purple)  Lactation Tools Discussed/Used Tools: Pump;Flanges;Coconut oil Flange Size: 24;27 Breast pump type: Double-Electric Breast Pump Pump Education: Setup, frequency, and cleaning;Milk Storage Reason for Pumping: ETI in NICU Pumping frequency: 8 times/24 hours Pumped volume: 5 mL  Interventions Interventions: Breast feeding basics reviewed;DEBP;Hand express;Breast massage;Education;Coconut oil  Discharge Pump: DEBP;Personal (Medela pump & style at home)  Consult Status Consult Status: Follow-up Follow-up type: In-patient    Alisha Norman Alisha Norman 10/06/2020, 5:09 PM

## 2020-10-06 NOTE — Lactation Note (Signed)
This note was copied from a baby's chart. Lactation Consultation Note  Patient Name: Alisha Norman [id number]'Z Date: 10/06/2020 Reason for consult: Primapara;1st time breastfeeding;Follow-up assessment;NICU baby;Early term 37-38.6wks;Other (Comment) (GHTN) Age:20 years  Visited with mom of 22 years old ETI NICU female, she's a P1 and reported she's tried to take baby to breast twice, praised her for her efforts. Mom has also been consistently pumping, she asked LC to show how to do the hand pump, mom able to do teach back afterwards; she was thrilled of getting some breastmilk.  Reviewed pumping expectations, pumping schedule, benefits of STS care and milk transitioning to volume. Mom requested a feeding assist at 5 pm this afternoon, she'd like to try again.  Plan of care:   Encouraged mom to continue pumping consistently every 2-3 hours, at least 8 times/24 hours Breast massage, hand expression and coconut oil were also encouraged prior pumping Mom will continue taking baby "Alisha Norman" to breast on feeding cues   FOB present and supportive. Parents reported all questions and concerns were answered, they're both aware of NICU LC services and will call PRN.  Maternal Data   Mom's milk supply is WNL  Feeding Mother's Current Feeding Choice: Breast Milk and Donor Milk  Lactation Tools Discussed/Used Tools: Pump;Flanges;Coconut oil Flange Size: 24;27 Breast pump type: Double-Electric Breast Pump Pump Education: Setup, frequency, and cleaning;Milk Storage Reason for Pumping: ETI in NICU Pumping frequency: 8 times/24 hours Pumped volume: 5 mL  Interventions Interventions: Breast feeding basics reviewed;DEBP;Hand express;Breast massage;Education;Coconut oil  Discharge Pump: DEBP;Personal (Medela pump & style at home)  Consult Status Consult Status: Follow-up Follow-up type: In-patient   Mushka Laconte [name] 10/06/2020, 1:31 PM

## 2020-10-06 NOTE — Progress Notes (Signed)
Daily Postpartum Note  Admission Date: 10/04/2020 Current Date: 10/06/2020 8:56 AM  Alisha Norman is a 20 y.o. G1P1001 PPD#1 s/p SVD/2nd (EBL 760m) @ 38wks due to PROM. Baby is in the NICU  Pregnancy complicated by: Patient Active Problem List   Diagnosis Date Noted   Preterm premature rupture of membranes 10/04/2020   Gestational hypertension 10/04/2020   Preterm labor 08/23/2020   Abnormal chromosomal and genetic finding on antenatal screening mother 05/09/2020   Rubella non-immune status, antepartum 04/30/2020   Anemia in pregnancy 04/30/2020   Supervision of normal first pregnancy 04/04/2020    Overnight/24hr events:  none  Subjective:  No s/s of pre-eclampsia. Meeting all PP goals.. baby is doing well.  Objective:    Current Vital Signs 24h Vital Sign Ranges  T 97.8 F (36.6 C) Temp  Avg: 97.9 F (36.6 C)  Min: 97.7 F (36.5 C)  Max: 98.1 F (36.7 C)  BP 112/65 BP  Min: 112/65  Max: 145/86  HR 90 Pulse  Avg: 100.8  Min: 90  Max: 111  RR 18 Resp  Avg: 18.2  Min: 18  Max: 19  SaO2 100 % Room Air SpO2  Avg: 99.8 %  Min: 99 %  Max: 100 %       24 Hour I/O Current Shift I/O  Time Ins Outs 08/26 0701 - 08/27 0700 In: 250  Out: -  No intake/output data recorded.   Patient Vitals for the past 24 hrs:  BP Temp Temp src Pulse Resp SpO2  10/06/20 0725 112/65 97.8 F (36.6 C) Oral 90 18 100 %  10/06/20 0532 128/70 97.9 F (36.6 C) Oral 92 18 100 %  10/05/20 2326 135/82 97.7 F (36.5 C) Oral (!) 111 19 100 %  10/05/20 2005 (!) 145/86 98.1 F (36.7 C) Oral (!) 102 18 100 %  10/05/20 1159 133/77 98 F (36.7 C) Oral (!) 109 18 99 %    Physical exam: General: Well nourished, well developed female in no acute distress. Abdomen: firm fundus below the umbilicus, nttp Cardiovascular: S1, S2 normal, no murmur, rub or gallop, regular rate and rhythm Respiratory: CTAB Extremities: no clubbing, cyanosis or edema Skin: Warm and dry.   Medications: Current  Facility-Administered Medications  Medication Dose Route Frequency Provider Last Rate Last Admin   acetaminophen (TYLENOL) tablet 650 mg  650 mg Oral Q4H PRN Cresenzo-Dishmon, FJoaquim Lai CNM       benzocaine-Menthol (DERMOPLAST) 20-0.5 % topical spray 1 application  1 application Topical PRN CChristin Fudge CNM   1 application at 059/74/1603845  bisacodyl (DULCOLAX) suppository 10 mg  10 mg Rectal Daily PRN Cresenzo-Dishmon, FJoaquim Lai CNM       coconut oil  1 application Topical PRN CChristin Fudge CNM   1 application at 036/46/801222   witch hazel-glycerin (TUCKS) pad 1 application  1 application Topical PRN Cresenzo-Dishmon, FJoaquim Lai CNM       And   dibucaine (NUPERCAINAL) 1 % rectal ointment 1 application  1 application Rectal PRN Cresenzo-Dishmon, FJoaquim Lai CNM       diphenhydrAMINE (BENADRYL) capsule 25 mg  25 mg Oral Q6H PRN Cresenzo-Dishmon, FJoaquim Lai CNM       docusate sodium (COLACE) capsule 100 mg  100 mg Oral BID CChristin Fudge CNM   100 mg at 10/05/20 2327   ferrous sulfate tablet 325 mg  325 mg Oral QODAY Cresenzo-Dishmon, FJoaquim Lai CNM   325 mg at 10/05/20 1155   ibuprofen (ADVIL) tablet 600 mg  600 mg Oral  Q6H Cresenzo-Dishmon, Joaquim Lai, CNM   600 mg at 10/06/20 0547   [COMPLETED] measles, mumps & rubella vaccine (MMR) injection 0.5 mL  0.5 mL Subcutaneous Once Christin Fudge, CNM   0.5 mL at 10/06/20 5009   medroxyPROGESTERone (DEPO-PROVERA) injection 150 mg  150 mg Intramuscular Prior to discharge Cresenzo-Dishmon, Joaquim Lai, CNM       methylergonovine (METHERGINE) tablet 0.2 mg  0.2 mg Oral Q4H PRN Cresenzo-Dishmon, Joaquim Lai, CNM       Or   methylergonovine (METHERGINE) injection 0.2 mg  0.2 mg Intramuscular Q4H PRN Cresenzo-Dishmon, Joaquim Lai, CNM       NIFEdipine (PROCARDIA-XL/NIFEDICAL-XL) 24 hr tablet 30 mg  30 mg Oral Daily Janyth Pupa, DO   30 mg at 10/05/20 1623   ondansetron (ZOFRAN) tablet 4 mg  4 mg Oral Q4H PRN Cresenzo-Dishmon,  Joaquim Lai, CNM       Or   ondansetron [facility]) injection 4 mg  4 mg Intravenous Q4H PRN Cresenzo-Dishmon, Joaquim Lai, CNM       prenatal multivitamin tablet 1 tablet  1 tablet Oral Daily Christin Fudge, CNM   1 tablet at 10/05/20 1155   simethicone (MYLICON) chewable tablet 80 mg  80 mg Oral PRN Cresenzo-Dishmon, Joaquim Lai, CNM       sodium phosphate (FLEET) 7-19 GM/118ML enema 1 enema  1 enema Rectal Daily PRN Christin Fudge, CNM        Labs:  Recent Labs  Lab 10/03/20 1005 10/04/20 1613 10/05/20 0457  WBC 5.7 6.0 12.0*  HGB 10.2* 10.8* 8.9*  HCT 30.6* 33.8* 28.0*  PLT 339 355 313    Recent Labs  Lab 10/03/20 1005 10/04/20 1628  NA 136 135  K 3.6 3.4*  CL 102 105  CO2 17* 21*  BUN 8 8  CREATININE 0.72 0.66  CALCIUM 9.4 9.6  PROT 6.5 7.0  BILITOT 0.2 0.7  ALKPHOS 208* 197*  ALT 55* 63*  AST 27 33  GLUCOSE 93 70     Radiology:  No new imaging  Assessment & Plan:  Pt doing well *PP: routine care. O POS. Breast. Girl. Pt desires nexplanon. She states she has medicaid so I told her if she wanted it prior to d/c to let us know or a bridge method *gHTN: continue procardia. F/u rpt cmp today *Anemia: asymptomatic. Continue po iron *Dispo: likely tomorrow. 1wk BP checked needed.   Durene Romans MD [facility] GYN Consult Phone: [phone] (M-F, [phone]) & [phone]  (Off hours, weekends, holidays)

## 2020-10-07 ENCOUNTER — Ambulatory Visit: Payer: Self-pay

## 2020-10-07 LAB — COMPREHENSIVE METABOLIC PANEL
ALT: 68 U/L — ABNORMAL HIGH (ref 0–44)
AST: 34 U/L (ref 15–41)
Albumin: 2.4 g/dL — ABNORMAL LOW (ref 3.5–5.0)
Alkaline Phosphatase: 126 U/L (ref 38–126)
Anion gap: 9 (ref 5–15)
BUN: 8 mg/dL (ref 6–20)
CO2: 24 mmol/L (ref 22–32)
Calcium: 8.9 mg/dL (ref 8.9–10.3)
Chloride: 104 mmol/L (ref 98–111)
Creatinine, Ser: 0.61 mg/dL (ref 0.44–1.00)
GFR, Estimated: >60 mL/min (ref >60)
Glucose, Bld: 85 mg/dL (ref 70–99)
Potassium: 3.4 mmol/L — ABNORMAL LOW (ref 3.5–5.1)
Sodium: 137 mmol/L (ref 135–145)
Total Bilirubin: 0.5 mg/dL (ref 0.3–1.2)
Total Protein: 5.4 g/dL — ABNORMAL LOW (ref 6.5–8.1)

## 2020-10-07 LAB — HEPATITIS PANEL, ACUTE
HCV Ab: NONREACTIVE
Hep A IgM: NONREACTIVE
Hep B C IgM: NONREACTIVE
Hepatitis B Surface Ag: NONREACTIVE

## 2020-10-07 MED ORDER — IBUPROFEN 600 MG PO TABS: 600.0000 mg | ORAL_TABLET | Freq: Four times a day (QID) | ORAL | 0 refills | Status: DC

## 2020-10-07 MED ORDER — NIFEDIPINE ER 30 MG PO TB24
30.0000 mg | ORAL_TABLET | Freq: Every day | ORAL | 1 refills | Status: DC
Start: 2020-10-07 — End: 2021-03-08

## 2020-10-07 MED ORDER — PRENATAL MULTIVITAMIN CH: 1.0000 | ORAL_TABLET | Freq: Every day | ORAL | Status: DC

## 2020-10-07 NOTE — Lactation Note (Signed)
This note was copied from a baby's chart. Lactation Consultation Note  Patient Name: Alisha Norman OZHYQ'M Date: 10/07/2020 Reason for consult: Primapara;1st time breastfeeding;NICU baby;Follow-up assessment;Early term 37-38.6wks;Other (Comment) (GHTN, maternal d/c today) Age:22 hours  Visited with mom of 2 hours old ETI NICU female, she got discharged today; LC reviewed d/c education, engorgement and sore nipples prevention/treatment and got mom some ice packs, she wasn't wearing a bra, her breast felt heavy after 2 hours of pumping and noticed some swelling; but not engorged yet.  Instructed mom to apply ice packs for 20-30 minutes every time before she pumps to bring the swelling down and soften the breast. Mom voiced bottom of the breasts (most bothersome area) felt a lot better after some deep tissue massage. Baby is now ad lib and may be ready for discharge tomorrow as well, mom plans to take her to breast but baby wasn't ready at this time, she was asleep. Asked mom to call for assistance when needed.  Plan of care:   Encouraged mom to continue pumping consistently every 2-3 hours, at least 8 times/24 hours Breast massage and ice packs were encouraged prior pumping Mom will continue taking baby "[name]" to breast on feeding cues   FOB present and supportive. Parents reported all questions and concerns were answered, they're both aware of NICU LC services and will call PRN.  Maternal Data   Mom's milk supply is WNL  Feeding Mother's Current Feeding Choice: Breast Milk and Donor Milk Nipple Type: Nfant Slow Flow (purple)  Lactation Tools Discussed/Used Tools: Pump;Flanges;Coconut oil Flange Size: 24;27 Breast pump type: Double-Electric Breast Pump Pump Education: Setup, frequency, and cleaning;Milk Storage Reason for Pumping: ETI in NICU Pumping frequency: 7 times/24 hours Pumped volume: 45 mL  Interventions Interventions: Breast feeding basics reviewed;Breast  massage;Ice;DEBP;Education;Coconut oil  Discharge Discharge Education: Engorgement and breast care;Outpatient recommendation (baby might get d/c tomorrow) Pump: DEBP;Personal (Medela pump & style at home)  Consult Status Consult Status: Follow-up Follow-up type: In-patient    Alisha Norman 10/07/2020, 6:07 PM

## 2020-10-07 NOTE — Plan of Care (Signed)
  Problem: Clinical Measurements: Goal: Ability to maintain clinical measurements within normal limits will improve Outcome: Progressing Goal: Diagnostic test results will improve Outcome: Progressing   Problem: Life Cycle: Goal: Chance of risk for complications during the postpartum period will decrease Outcome: Progressing

## 2020-10-09 ENCOUNTER — Ambulatory Visit: Payer: Self-pay

## 2020-10-09 ENCOUNTER — Other Ambulatory Visit: Payer: Self-pay

## 2020-10-09 LAB — SURGICAL PATHOLOGY

## 2020-10-09 NOTE — Lactation Note (Signed)
This note was copied from a baby's chart. Lactation Consultation Note  Patient Name: Alisha Norman VOJJK'K Date: 10/09/2020 Reason for consult: Follow-up assessment;NICU baby;Primapara;1st time breastfeeding;Early term 37-38.6wks Age:22 days  Visited with mom of 34 days old ETI female, she's a P1 and reported that engorgement has resolved, she's no longer experiencing pain or discomfort at the breast.  Reviewed discharge education, engorgement prevention/ treatment and pumping schedule, mom understands she still need to pump after feedings at the breast to protect her supply.  FOB present, all questions and concerns answered. Family aware of NICU LC services and will contact PRN.  Maternal Data   Mom's milk supply is WNL  Feeding Mother's Current Feeding Choice: Breast Milk Nipple Type: Slow - flow  Lactation Tools Discussed/Used Tools: Pump;Flanges Flange Size: 24;27 Breast pump type: Double-Electric Breast Pump Pump Education: Setup, frequency, and cleaning;Milk Storage Reason for Pumping: ETI in NICU Pumping frequency: 7 times/24 hours Pumped volume: 60 mL (but varies)  Interventions Interventions: Breast feeding basics reviewed;DEBP;Education  Discharge Discharge Education: Engorgement and breast care Pump: DEBP;Manual (Medela Pump & Style at home)  Consult Status Consult Status: Complete Date: 10/09/20 Follow-up type: Call as needed    Alisha Norman [name] 10/09/2020, 2:56 PM

## 2020-10-09 NOTE — Telephone Encounter (Signed)
Pt requested a 90 day supply for Nifedipine. Called pharmacy to explain that the pt may not need this medication once seen at her PP visit, in case the pt asks.

## 2020-10-10 ENCOUNTER — Encounter: Payer: Managed Care, Other (non HMO) | Admitting: Advanced Practice Midwife

## 2020-10-12 ENCOUNTER — Telehealth (INDEPENDENT_AMBULATORY_CARE_PROVIDER_SITE_OTHER): Payer: Managed Care, Other (non HMO)

## 2020-10-12 VITALS — BP 120/81 | HR 96

## 2020-10-12 DIAGNOSIS — Z0130 Encounter for examination of blood pressure without abnormal findings: Secondary | ICD-10-CM

## 2020-10-12 NOTE — Progress Notes (Addendum)
  I connected with  Alisha Norman on 10/12/20 by a video enabled telemedicine application and verified that I am speaking with the correct person using two identifiers.   I discussed the limitations of evaluation and management by telemedicine. The patient expressed understanding and agreed to proceed.  Patient: home Provider: office  NURSE VISIT- BLOOD PRESSURE CHECK  SUBJECTIVE:  Alisha Norman is a 20 y.o. G62P1001 female here for BP check. She is postpartum, delivery date 10/05/20     HYPERTENSION ROS:  Postpartum:  Severe headaches that don't go away with tylenol/other medicines: No  Visual changes (seeing spots/double/blurred vision) No  Severe pain under right breast breast or in center of upper chest No  Severe nausea/vomiting No  Taking medicines as instructed yes  OBJECTIVE:  BP 120/81 (BP Location: Left Arm, Patient Position: Sitting, Cuff Size: Normal)   Pulse 96   LMP 01/10/2020   Breastfeeding Yes   Appearance alert, well appearing, and in no distress and oriented to person, place, and time.  ASSESSMENT: Postpartum  blood pressure check  PLAN: Discussed with Joellyn Haff, CNM, [facility]   Recommendations: stop medicine 2 days before next visit   Follow-up: as scheduled   Rhylee Pucillo A Willistine Ferrall  10/12/2020 11:32 AM   Chart reviewed for nurse visit. Agree with plan of care.  Cheral Marker, [place] 10/12/2020 11:59 AM

## 2020-10-17 ENCOUNTER — Telehealth (HOSPITAL_COMMUNITY): Payer: Self-pay | Admitting: *Deleted

## 2020-10-17 NOTE — Telephone Encounter (Signed)
Patient voiced no questions or concerns regarding her own health. EPDS = 2. Patient voiced no questions or concerns regarding baby at this time. Patient reported infant sleeps in a bassinet on her back. RN reviewed ABCs of safe sleep - patient verbalized understanding. Patient requested RN email information on hospital's virtual breastfeeding and pumping support groups. Email sent. Deforest Hoyles, RN, 10/17/20, 1714.

## 2020-11-09 ENCOUNTER — Other Ambulatory Visit: Payer: Self-pay

## 2020-11-09 ENCOUNTER — Ambulatory Visit (INDEPENDENT_AMBULATORY_CARE_PROVIDER_SITE_OTHER): Payer: Managed Care, Other (non HMO) | Admitting: Women's Health

## 2020-11-09 ENCOUNTER — Encounter: Payer: Self-pay | Admitting: Women's Health

## 2020-11-09 DIAGNOSIS — Z8759 Personal history of other complications of pregnancy, childbirth and the puerperium: Secondary | ICD-10-CM | POA: Diagnosis not present

## 2020-11-09 DIAGNOSIS — Z30017 Encounter for initial prescription of implantable subdermal contraceptive: Secondary | ICD-10-CM

## 2020-11-09 DIAGNOSIS — Z3202 Encounter for pregnancy test, result negative: Secondary | ICD-10-CM | POA: Diagnosis not present

## 2020-11-09 LAB — POCT URINE PREGNANCY: Preg Test, Ur: NEGATIVE

## 2020-11-09 MED ORDER — ETONOGESTREL 68 MG ~~LOC~~ IMPL
68.0000 mg | DRUG_IMPLANT | Freq: Once | SUBCUTANEOUS | Status: AC
  Administered 2020-11-09: 68 mg via SUBCUTANEOUS

## 2020-11-09 MED ORDER — IBUPROFEN 600 MG PO TABS: 600.0000 mg | ORAL_TABLET | Freq: Four times a day (QID) | ORAL | 0 refills | Status: DC

## 2020-11-09 NOTE — Progress Notes (Signed)
POSTPARTUM VISIT Patient name: Alisha Norman MRN [id number]  Date of birth: 17-Jul-2000 Chief Complaint:   Postpartum Care  History of Present Illness:   DEBAR PLATE is a 20 y.o. G69P1001 African American female being seen today for a postpartum visit. She is 5 weeks postpartum following a spontaneous vaginal delivery at 38.3 gestational weeks. IOL: no, for n/a. Anesthesia: epidural.  Laceration: 2nd degree.  Complications: none. Inpatient contraception: no.   Pregnancy complicated by intrpartum GHTN . Tobacco use: no. Substance use disorder: no. Last pap smear: 10/25/19 and results were NILM w/ HRHPV not done. Next pap smear due: 2024 Patient's last menstrual period was 11/09/2020 (exact date).  Postpartum course has been complicated by PPHTN, dc'd on procardia, has since stopped as directed . Requests refill on ibuprofen for cramping. Bleeding  period started yesterday . Bowel function is normal. Bladder function is normal. Urinary incontinence? no, fecal incontinence? no Patient is not sexually active. Last sexual activity: prior to birth of baby. Desired contraception: Nexplanon. Patient does want a pregnancy in the future.  Desired family size is 2 children.   Upstream - 11/09/20 1146       Pregnancy Intention Screening   Does the patient want to become pregnant in the next year? No    Does the patient's partner want to become pregnant in the next year? No    Would the patient like to discuss contraceptive options today? Yes      Contraception Wrap Up   Current Method Abstinence    End Method Hormonal Implant    Contraception Counseling Provided Yes            The pregnancy intention screening data noted above was reviewed. Potential methods of contraception were discussed. The patient elected to proceed with Hormonal Implant.  Edinburgh Postpartum Depression Screening: negative  Edinburgh Postnatal Depression Scale - 11/09/20 1145       Edinburgh Postnatal Depression  Scale:  In the Past 7 Days   I have been able to laugh and see the funny side of things. 0    I have looked forward with enjoyment to things. 0    I have blamed myself unnecessarily when things went wrong. 0    I have been anxious or worried for no good reason. 0    I have felt scared or panicky for no good reason. 0    Things have been getting on top of me. 0    I have been so unhappy that I have had difficulty sleeping. 0    I have felt sad or miserable. 0    I have been so unhappy that I have been crying. 0    The thought of harming myself has occurred to me. 0    Edinburgh Postnatal Depression Scale Total 0             GAD 7 : Generalized Anxiety Score 04/25/2020 10/25/2019  Nervous, Anxious, on Edge 0 0  Control/stop worrying 0 0  Worry too much - different things 0 0  Trouble relaxing 0 0  Restless 0 0  Easily annoyed or irritable 0 0  Afraid - awful might happen 0 0  Total GAD 7 Score 0 0     Baby's course has been complicated by few day NICU stay . Baby is feeding by breast: milk supply adequate. Infant has a pediatrician/family doctor? Yes.  Childcare strategy if returning to work/school: family.  Pt has material needs met for her and  baby: Yes.   Review of Systems:   Pertinent items are noted in HPI Denies Abnormal vaginal discharge w/ itching/odor/irritation, headaches, visual changes, shortness of breath, chest pain, abdominal pain, severe nausea/vomiting, or problems with urination or bowel movements. Pertinent History Reviewed:  Reviewed past medical,surgical, obstetrical and family history.  Reviewed problem list, medications and allergies. OB History  Gravida Para Term Preterm AB Living  _0 0 0 1  SAB IAB Ectopic Multiple Live Births  0 0 0 0 1    # Outcome Date GA Lbr Len/2nd Weight Sex Delivery Anes PTL Lv  1 Term 10/05/20 32w3d13:04 / 00:26 7 lb 8.8 oz (3.425 kg) F Vag-Spont EPI  LIV   Physical Assessment:   Vitals:   11/09/20 1142  BP: 115/75   Pulse: 81  Weight: 200 lb 3.2 oz (90.8 kg)  Height: _1  (1.753 m)  Body mass index is 29.56 kg/m.       Physical Examination:   General appearance: alert, well appearing, and in no distress  Mental status: alert, oriented to person, place, and time  Skin: warm & dry   Cardiovascular: normal heart rate noted   Respiratory: normal respiratory effort, no distress   Breasts: deferred, no complaints   Abdomen: soft, non-tender   Pelvic: lac healing well, still couple of sutures present. Thin prep pap obtained: No  Rectal: not examined  Extremities: Edema: none   Chaperone: ACelene Squibb        Results for orders placed or performed in visit on 11/09/20 (from the past 24 hour(s))  POCT urine pregnancy   Collection Time: 11/09/20 11:54 AM  Result Value Ref Range   Preg Test, Ur Negative Negative     NEXPLANON INSERTION  Risks/benefits/side effects of Nexplanon have been discussed and her questions have been answered.  Specifically, a failure rate of 02/998 has been reported, with an increased failure rate if pt takes [place]/or antiseizure medicaitons.  She is aware of the common side effect of irregular bleeding, which the incidence of decreases over time. Signed copy of informed consent in chart.   Time out was performed.  She is right-handed, so her left arm, approximately 10cm from the medial epicondyle and 3-5cm posterior to the sulcus, was cleansed with alcohol and anesthetized with 2cc of 2% Lidocaine.  The area was cleansed again with betadine and the Nexplanon was inserted per manufacturer's recommendations without difficulty.  3 steri-strips and pressure bandage were applied. The patient tolerated the procedure well.   Assessment & Plan:  1) Postpartum exam 2) 5 wks s/p spontaneous vaginal delivery 3) breast feeding 4) Depression screening 5) Nexplanon insertion Pt was instructed to keep the area clean and dry, remove pressure bandage in 24 hours, and  keep insertion site covered with the steri-strip for 3-5 days.  Condoms for 2 weeks.  She was given a card indicating date Nexplanon was inserted and date it needs to be removed. Follow-up PRN problems. 6) Resolved PPHTN  Essential components of care per ACOG recommendations:  1.  Mood and well being:  If positive depression screen, discussed and plan developed.  If using tobacco we discussed reduction/cessation and risk of relapse If current substance abuse, we discussed and referral to local resources was offered.   2. Infant care and feeding:  If breastfeeding, discussed returning to work, pumping, breastfeeding-associated pain, guidance regarding return to fertility while lactating if not using another method. If needed, patient was provided with a  letter to be allowed to pump q 2-3hrs to support lactation in a private location with access to a refrigerator to store breastmilk.   Recommended that all caregivers be immunized for flu, pertussis and other preventable communicable diseases If pt does not have material needs met for her/baby, referred to local resources for help obtaining these.  3. Sexuality, contraception and birth spacing Provided guidance regarding sexuality, management of dyspareunia, and resumption of intercourse Discussed avoiding interpregnancy interval <61mhs and recommended birth spacing of 18 months  4. Sleep and fatigue Discussed coping options for fatigue and sleep disruption Encouraged family/partner/community support of 4 hrs of uninterrupted sleep to help with mood and fatigue  5. Physical recovery  If pt had a C/S, assessed incisional pain and providing guidance on normal vs prolonged recovery If pt had a laceration, perineal healing and pain reviewed.  If urinary or fecal incontinence, discussed management and referred to PT or uro/gyn if indicated  Patient is safe to resume physical activity. Discussed attainment of healthy weight.  6.  Chronic disease  management Discussed pregnancy complications if any, and their implications for future childbearing and long-term maternal health. Review recommendations for prevention of recurrent pregnancy complications, such as 17 hydroxyprogesterone caproate to reduce risk for recurrent PTB not applicable, or aspirin to reduce risk of preeclampsia yes. Pt had GDM: No. If yes, 2hr GTT scheduled: not applicable. Reviewed medications and non-pregnant dosing including consideration of whether pt is breastfeeding using a reliable resource such as LactMed: yes Referred for f/u w/ PCP or subspecialist providers as indicated: not applicable  7. Health maintenance Mammogram at [id number] earlier if indicated Pap smears as indicated  Meds:  Meds ordered this encounter  Medications   etonogestrel (NEXPLANON) implant 68 mg   ibuprofen (ADVIL) 600 MG tablet    Sig: Take 1 tablet (600 mg total) by mouth every 6 (six) hours.    Dispense:  30 tablet    Refill:  0    Order Specific Question:   Supervising Provider    Answer:   EFlorian Buff[[id number]]    Follow-up: Return in about 1 year (around 11/09/2021) for Physical.   Orders Placed This Encounter  Procedures   POCT urine pregnancy    [facility]9/30/2022 12:18 PM

## 2020-11-09 NOTE — Patient Instructions (Signed)
Keep the area clean and dry.  You can remove the big bandage in 24 hours, and the small steri-strip bandage in 3-5 days.  A back up method, such as condoms, should be used for two weeks. You may have irregular vaginal bleeding for the first 6 months after the Nexplanon is placed, then the bleeding usually lightens and it is possible that you may not have any periods.  If you have any concerns, please give us a call.   

## 2020-12-31 ENCOUNTER — Emergency Department (HOSPITAL_COMMUNITY)
Admission: EM | Admit: 2020-12-31 | Discharge: 2020-12-31 | Disposition: A | Discharge status: Home / Self Care | Payer: Managed Care, Other (non HMO) | Attending: Emergency Medicine | Admitting: Emergency Medicine

## 2020-12-31 ENCOUNTER — Other Ambulatory Visit: Payer: Self-pay

## 2020-12-31 DIAGNOSIS — R1013 Epigastric pain: Secondary | ICD-10-CM | POA: Diagnosis not present

## 2020-12-31 DIAGNOSIS — E876 Hypokalemia: Secondary | ICD-10-CM | POA: Diagnosis not present

## 2020-12-31 DIAGNOSIS — M545 Low back pain, unspecified: Secondary | ICD-10-CM | POA: Insufficient documentation

## 2020-12-31 DIAGNOSIS — R61 Generalized hyperhidrosis: Secondary | ICD-10-CM | POA: Diagnosis not present

## 2020-12-31 DIAGNOSIS — Z7982 Long term (current) use of aspirin: Secondary | ICD-10-CM | POA: Diagnosis not present

## 2020-12-31 DIAGNOSIS — R112 Nausea with vomiting, unspecified: Secondary | ICD-10-CM | POA: Diagnosis not present

## 2020-12-31 LAB — COMPREHENSIVE METABOLIC PANEL
ALT: 115 U/L — ABNORMAL HIGH (ref 0–44)
AST: 41 U/L (ref 15–41)
Albumin: 4.4 g/dL (ref 3.5–5.0)
Alkaline Phosphatase: 201 U/L — ABNORMAL HIGH (ref 38–126)
Anion gap: 6 (ref 5–15)
BUN: 14 mg/dL (ref 6–20)
CO2: 25 mmol/L (ref 22–32)
Calcium: 9.6 mg/dL (ref 8.9–10.3)
Chloride: 106 mmol/L (ref 98–111)
Creatinine, Ser: 0.67 mg/dL (ref 0.44–1.00)
GFR, Estimated: >60 mL/min (ref >60)
Glucose, Bld: 90 mg/dL (ref 70–99)
Potassium: 3.2 mmol/L — ABNORMAL LOW (ref 3.5–5.1)
Sodium: 137 mmol/L (ref 135–145)
Total Bilirubin: 0.1 mg/dL — ABNORMAL LOW (ref 0.3–1.2)
Total Protein: 8.9 g/dL — ABNORMAL HIGH (ref 6.5–8.1)

## 2020-12-31 LAB — CBC
HCT: 35.5 % — ABNORMAL LOW (ref 36.0–46.0)
Hemoglobin: 11.1 g/dL — ABNORMAL LOW (ref 12.0–15.0)
MCH: 25.5 pg — ABNORMAL LOW (ref 26.0–34.0)
MCHC: 31.3 g/dL (ref 30.0–36.0)
MCV: 81.6 fL (ref 80.0–100.0)
Platelets: 486 10*3/uL — ABNORMAL HIGH (ref 150–400)
RBC: 4.35 MIL/uL (ref 3.87–5.11)
RDW: 13.6 % (ref 11.5–15.5)
WBC: 8.5 10*3/uL (ref 4.0–10.5)
nRBC: 0 % (ref 0.0–0.2)

## 2020-12-31 LAB — URINALYSIS, ROUTINE W REFLEX MICROSCOPIC
Bilirubin Urine: NEGATIVE
Glucose, UA: NEGATIVE mg/dL
Hgb urine dipstick: NEGATIVE
Ketones, ur: NEGATIVE mg/dL
Leukocytes,Ua: NEGATIVE
Nitrite: NEGATIVE
Protein, ur: NEGATIVE mg/dL
Specific Gravity, Urine: 1.03 (ref 1.005–1.030)
pH: 5 (ref 5.0–8.0)

## 2020-12-31 LAB — LIPASE, BLOOD: Lipase: 34 U/L (ref 11–51)

## 2020-12-31 MED ORDER — POTASSIUM CHLORIDE CRYS ER 20 MEQ PO TBCR
40.0000 meq | EXTENDED_RELEASE_TABLET | Freq: Once | ORAL | Status: AC
  Administered 2020-12-31: 40 meq via ORAL
  Filled 2020-12-31: qty 2

## 2020-12-31 NOTE — ED Provider Notes (Signed)
[facility] EMERGENCY DEPARTMENT Provider Note   CSN: [id number] Arrival date & time: 12/31/20  1334     History No chief complaint on file.   Alisha Norman is a 20 y.o. female presents to the emergency department for evaluation of episodic epigastric pain with nausea, vomiting, and diaphoresis.  Patient reports that the episode started whenever she gave birth to her child in late August.  She reports she has had about 1 episode a month for the past 3 months and the last anywhere between 15 to 20 minutes.  She reports he felt cramping and throbbing in nature and "feels like contraction".  Reports it radiates to her mid back.  She denies any chest pain, shortness of breath, fevers, diarrhea, or constipation.  Denies any vaginal discharge or vaginal bleeding.  Denies any dysuria or hematuria.  Denies any dark or tarry stools.  Patient is currently breast-feeding.  Medical history includes anemia.  Denies any surgical history.  Medications include prenatal vitamins.  She is allergic to codeine and penicillin.  Denies any tobacco, EtOH, or drug use.  HPI     Past Medical History:  Diagnosis Date   Amnesia    Concussion    Dermatitis    Enlarged liver    Headache    Hepatitis     Patient Active Problem List   Diagnosis Date Noted   History of gestational hypertension & PPHTN 11/09/2020   History of postpartum hemorrhage 11/09/2020   Abnormal chromosomal and genetic finding on antenatal screening mother 05/09/2020   Nexplanon insertion 10/25/2014    Past Surgical History:  Procedure Laterality Date   NO PAST SURGERIES       OB History     Gravida  1   Para  1   Term  1   Preterm  0   AB  0   Living  1      SAB  0   IAB  0   Ectopic  0   Multiple  0   Live Births  1           Family History  Problem Relation Age of Onset   Hypertension Mother    Diabetes Maternal Grandmother    Heart attack Paternal Grandfather     Social History   Tobacco  Use   Smoking status: Never   Smokeless tobacco: Never  Vaping Use   Vaping Use: Never used  Substance Use Topics   Alcohol use: Not Currently   Drug use: No    Home Medications Prior to Admission medications   Medication Sig Start Date End Date Taking? Authorizing Provider  aspirin 81 MG EC tablet Take 1 tablet (81 mg total) by mouth daily. Swallow whole. Patient not taking: No sig reported 05/23/20   Roma Schanz, CNM  ferrous sulfate 325 (65 FE) MG tablet Take 1 tablet (325 mg total) by mouth every other day. Patient not taking: Reported on 11/09/2020 07/31/20   Roma Schanz, CNM  ibuprofen (ADVIL) 600 MG tablet Take 1 tablet (600 mg total) by mouth every 6 (six) hours. 11/09/20   Roma Schanz, CNM  NIFEdipine (ADALAT CC) 30 MG 24 hr tablet Take 1 tablet (30 mg total) by mouth daily. Patient not taking: Reported on 11/09/2020 10/07/20   Radene Gunning, MD  Prenatal Vit-Fe Fumarate-FA (PRENATAL MULTIVITAMIN) TABS tablet Take 1 tablet by mouth daily. 10/07/20   Radene Gunning, MD    Allergies    Codeine and Penicillins  Review of Systems   Review of Systems  Constitutional:  Positive for diaphoresis. Negative for chills and fever.  HENT:  Negative for ear pain and sore throat.   Eyes:  Negative for pain and visual disturbance.  Respiratory:  Negative for cough and shortness of breath.   Cardiovascular:  Negative for chest pain and palpitations.  Gastrointestinal:  Positive for abdominal pain, nausea and vomiting. Negative for constipation and diarrhea.  Genitourinary:  Negative for dysuria and hematuria.  Musculoskeletal:  Negative for arthralgias and back pain.  Skin:  Negative for color change and rash.  Neurological:  Negative for seizures and syncope.  All other systems reviewed and are negative.  Physical Exam Updated Vital Signs BP (!) 136/94   Pulse 81   Temp 98.1 F (36.7 C) (Oral)   Resp 14   Ht 5' 9" (1.753 m)   Wt 88.5 kg   LMP 11/22/2020 Comment:  nexplanon  SpO2 99%   Breastfeeding Yes   BMI 28.80 kg/m   Physical Exam Vitals and nursing note reviewed.  Constitutional:      General: She is not in acute distress.    Appearance: Normal appearance. She is not toxic-appearing.  HENT:     Head: Normocephalic and atraumatic.     Mouth/Throat:     Mouth: Mucous membranes are moist.  Eyes:     General: No scleral icterus. Cardiovascular:     Rate and Rhythm: Normal rate and regular rhythm.  Pulmonary:     Effort: Pulmonary effort is normal. No respiratory distress.     Breath sounds: Normal breath sounds.  Abdominal:     General: Abdomen is flat. Bowel sounds are normal.     Palpations: Abdomen is soft.     Tenderness: There is abdominal tenderness. There is no guarding or rebound.     Comments: Mild tenderness to epigastric and right upper quadrant.  Positive Murphy sign.  Tattoo present.  No other overlying skin changes, erythema, ecchymosis, or rash noted.  Musculoskeletal:        General: No deformity.     Cervical back: Normal range of motion.  Skin:    General: Skin is warm and dry.  Neurological:     General: No focal deficit present.     Mental Status: She is alert. Mental status is at baseline.     Motor: No weakness.     Gait: Gait normal.    ED Results / Procedures / Treatments   Labs (all labs ordered are listed, but only abnormal results are displayed) Labs Reviewed  CBC - Abnormal; Notable for the following components:      Result Value   Hemoglobin 11.1 (*)    HCT 35.5 (*)    MCH 25.5 (*)    Platelets 486 (*)    All other components within normal limits  COMPREHENSIVE METABOLIC PANEL - Abnormal; Notable for the following components:   Potassium 3.2 (*)    Total Protein 8.9 (*)    ALT 115 (*)    Alkaline Phosphatase 201 (*)    Total Bilirubin 0.1 (*)    All other components within normal limits  URINALYSIS, ROUTINE W REFLEX MICROSCOPIC - Abnormal; Notable for the following components:    APPearance HAZY (*)    All other components within normal limits  LIPASE, BLOOD    EKG None  Radiology No results found.  Procedures Procedures   Medications Ordered in ED Medications  potassium chloride SA (KLOR-CON) CR tablet 40 mEq (  has no administration in time range)    ED Course  I have reviewed the triage vital signs and the nursing notes.  Pertinent labs & imaging results that were available during my care of the patient were reviewed by me and considered in my medical decision making (see chart for details).  20 year old female presents to the emergency department for evaluation of epigastric pain intermittently for the past 3 months.  Nonsurgical abdomen.  Labs ordered.  CMP shows mildly hypokalemic at 3.2.  P.o. potassium ordered.  The patient has elevated ALT and alk phos.  No other electrolyte abnormalities.  Urinalysis shows hazy urine, otherwise normal.  Not consistent with UTI.  CBC shows mild anemia, patient with known history.  Is have improved from her last numbers 2 months ago.  No leukocytosis present.  Vital signs stable.  Patient afebrile.  No acute distress.  Will refer for outpatient ultrasound of her right upper quadrant.  This is likely cholelithiasis given the interview and lab work.  Low suspicion for pancreatitis.  Low suspicion for cholecystitis or cholangitis.  Return precautions discussed with patient.  Advised her to follow-up with her PCP when she gets her ultrasound done.  Patient agrees to plan.  Patient is stable being discharged home in good condition.  I discussed this case with my attending physician who cosigned this note including patient's presenting symptoms, physical exam, and planned diagnostics and interventions. Attending physician stated agreement with plan or made changes to plan which were implemented.   Attending physician assessed patient at bedside.    MDM Rules/Calculators/A&P                          Final Clinical  Impression(s) / ED Diagnoses Final diagnoses:  Epigastric pain  Hypokalemia    Rx / DC Orders ED Discharge Orders          Ordered    US Abdomen Limited RUQ/Gall Gladder        12/31/20 2257             Sherrell Puller, PA-C 12/31/20 2313    Milton Ferguson, MD 01/01/21 1444

## 2020-12-31 NOTE — Discharge Instructions (Addendum)
You were seen here today for evaluation of your episodic abdominal pain.  From your labs and interview, it sounds like this is likely something to do with her gallbladder.  There is no signs of infection with your lab work and your vital signs are stable.  It is safe for you to obtain an patient ultrasound.  Please make an appoint with your PCP when you get this imaging so that they can review it with you.  If you have any worsening abdominal pain, fever, nausea or vomiting, please return to the nearest emergency department for reevaluation.  Additionally, if you have any concern, new or worsening symptoms, return to the emergency department for reevaluation.

## 2020-12-31 NOTE — ED Triage Notes (Signed)
Pt states since giving birth in August she had back pain that radiates to upper abdomen. Pt states the pain comes and goes often but today she broke out and a sweat and she felt as though she was going to pass out.

## 2021-01-10 ENCOUNTER — Ambulatory Visit (HOSPITAL_COMMUNITY): Payer: Managed Care, Other (non HMO)

## 2021-02-14 ENCOUNTER — Ambulatory Visit: Payer: Managed Care, Other (non HMO) | Admitting: Nurse Practitioner

## 2021-03-08 ENCOUNTER — Ambulatory Visit (INDEPENDENT_AMBULATORY_CARE_PROVIDER_SITE_OTHER): Payer: Managed Care, Other (non HMO) | Admitting: Nurse Practitioner

## 2021-03-08 ENCOUNTER — Encounter: Payer: Self-pay | Admitting: Nurse Practitioner

## 2021-03-08 ENCOUNTER — Other Ambulatory Visit: Payer: Self-pay

## 2021-03-08 VITALS — BP 125/86 | HR 87 | Resp 16 | Ht 68.0 in | Wt 193.0 lb

## 2021-03-08 DIAGNOSIS — Z0000 Encounter for general adult medical examination without abnormal findings: Secondary | ICD-10-CM

## 2021-03-08 DIAGNOSIS — R1013 Epigastric pain: Secondary | ICD-10-CM | POA: Diagnosis not present

## 2021-03-08 DIAGNOSIS — E663 Overweight: Secondary | ICD-10-CM | POA: Diagnosis not present

## 2021-03-08 DIAGNOSIS — R109 Unspecified abdominal pain: Secondary | ICD-10-CM | POA: Insufficient documentation

## 2021-03-08 DIAGNOSIS — Z23 Encounter for immunization: Secondary | ICD-10-CM

## 2021-03-08 NOTE — Patient Instructions (Signed)
Please get your covid booster at your pharmacy.  ° °It is important that you exercise regularly at least 30 minutes 5 times a week.  °Think about what you will eat, plan ahead. °Choose " clean, green, fresh or frozen" over canned, processed or packaged foods which are more sugary, salty and fatty. °70 to 75% of food eaten should be vegetables and fruit. °Three meals at set times with snacks allowed between meals, but they must be fruit or vegetables. °Aim to eat over a 12 hour period , example 7 am to 7 pm, and STOP after  your last meal of the day. °Drink water,generally about 64 ounces per day, no other drink is as healthy. Fruit juice is best enjoyed in a healthy way, by EATING the fruit. ° °Thanks for choosing [facility], we consider it a privelige to serve you. ° °

## 2021-03-08 NOTE — Assessment & Plan Note (Signed)
Annual exam as documented.  ?Counseling done include healthy lifestyle involving committing to 150 minutes of exercise per week, heart healthy diet, and attaining healthy weight. The importance of adequate sleep also discussed.  ?Regular use of seat belt and home safety were also discussed . ?Changes in health habits are decided on by patient with goals and time frames set for achieving them. ?Immunization and cancer screening  needs are specifically addressed at this visit.   ?

## 2021-03-08 NOTE — Progress Notes (Signed)
New Patient Office Visit  Subjective:  Patient ID: Alisha Norman, female    DOB: 09/27/2000  Age: 21 y.o. MRN: [id number]  CC:  Chief Complaint  Patient presents with   Establish Care    Pt does not have a PCP    HPI Alisha Norman presents for establish care. No previous PCP.   Pt stated that she started having  abdominal pain after having her baby in Agust 2022,  her liver enzymes were elevated then and they thought she might have gall stones.  Pt stated that she is still having mid upper abdoiminal pain radiating towards her left, described her pain as unbearable intermittent cramps. Greasy foods aggravates her pain, gets nauseous when she has the episode. Has the pain once or twice in 3 months. Laying down and drinking water makes the pain go away. Denies bloody stool, vomiting constipation.    Pt will bring her vaccine records to update her chart.   Past Medical History:  Diagnosis Date   Amnesia    Concussion    Dermatitis    Enlarged liver    Headache    Hepatitis     Past Surgical History:  Procedure Laterality Date   NO PAST SURGERIES      Family History  Problem Relation Age of Onset   Hypertension Mother    Diabetes Maternal Grandmother    Heart attack Paternal Grandfather     Social History   Socioeconomic History   Marital status: Single    Spouse name: Not on file   Number of children: Not on file   Years of education: Not on file   Highest education level: Not on file  Occupational History   Not on file  Tobacco Use   Smoking status: Never   Smokeless tobacco: Never  Vaping Use   Vaping Use: Never used  Substance and Sexual Activity   Alcohol use: Not Currently   Drug use: No   Sexual activity: Not Currently    Birth control/protection: None    Comment: Pt reports she wants nexplanon  Other Topics Concern   Not on file  Social History Narrative   Not on file   Social Determinants of Health   Financial Resource Strain: Low Risk     Difficulty of Paying Living Expenses: Not hard at all  Food Insecurity: No Food Insecurity   Worried About Programme researcher, broadcasting/film/videounning Out of Food in the Last Year: Never true   Ran Out of Food in the Last Year: Never true  Transportation Needs: No Transportation Needs   Lack of Transportation (Medical): No   Lack of Transportation (Non-Medical): No  Physical Activity: Inactive   Days of Exercise per Week: 0 days   Minutes of Exercise per Session: 0 min  Stress: No Stress Concern Present   Feeling of Stress : Not at all  Social Connections: Moderately Integrated   Frequency of Communication with Friends and Family: More than three times a week   Frequency of Social Gatherings with Friends and Family: Twice a week   Attends Religious Services: 1 to 4 times per year   Active Member of [employer] or Organizations: No   Attends [employer] or Organization Meetings: Never   Marital Status: Living with partner  Intimate Partner Violence: Not At Risk   Fear of Current or Ex-Partner: No   Emotionally Abused: No   Physically Abused: No   Sexually Abused: No    ROS Review of Systems  Constitutional: Negative.  HENT: Negative.    Eyes: Negative.   Respiratory: Negative.    Cardiovascular: Negative.   Gastrointestinal: Negative.   Endocrine: Negative.   Genitourinary: Negative.   Musculoskeletal: Negative.   Skin: Negative.   Allergic/Immunologic: Negative.   Neurological: Negative.   Hematological: Negative.   Psychiatric/Behavioral: Negative.     Objective:   Today's Vitals: BP 125/86    Pulse 87    Resp 16    Ht 5\' 8"  (1.727 m)    Wt 193 lb 0.6 oz (87.6 kg)    SpO2 97%    BMI 29.35 kg/m   Cherry CMA present as chaperone Physical Exam Constitutional:      General: She is not in acute distress.    Appearance: Normal appearance. She is obese. She is not ill-appearing, toxic-appearing or diaphoretic.  HENT:     Right Ear: Tympanic membrane, ear canal and external ear normal. There is no impacted cerumen.      Left Ear: Tympanic membrane, ear canal and external ear normal. There is no impacted cerumen.     Nose: No congestion or rhinorrhea.     Mouth/Throat:     Mouth: Mucous membranes are moist.     Pharynx: Oropharynx is clear. No oropharyngeal exudate or posterior oropharyngeal erythema.  Eyes:     General: No scleral icterus.       Right eye: No discharge.        Left eye: No discharge.     Extraocular Movements: Extraocular movements intact.     Conjunctiva/sclera: Conjunctivae normal.  Neck:     Vascular: No carotid bruit.  Cardiovascular:     Rate and Rhythm: Normal rate and regular rhythm.     Heart sounds: No murmur heard.   No friction rub. No gallop.  Pulmonary:     Effort: Pulmonary effort is normal. No respiratory distress.     Breath sounds: Normal breath sounds. No stridor. No wheezing, rhonchi or rales.  Chest:     Chest wall: No mass, lacerations, deformity, swelling, tenderness or edema.  Breasts:    Right: Normal. No swelling, bleeding, inverted nipple, mass, nipple discharge, skin change or tenderness.     Left: Normal. No swelling, bleeding, inverted nipple, mass, nipple discharge, skin change or tenderness.  Abdominal:     General: There is no distension.     Palpations: Abdomen is soft. There is no mass.     Tenderness: There is no abdominal tenderness. There is no right CVA tenderness, left CVA tenderness, guarding or rebound.     Hernia: No hernia is present.  Musculoskeletal:        General: No swelling, tenderness, deformity or signs of injury.     Cervical back: Normal range of motion and neck supple. No rigidity or tenderness.     Right lower leg: No edema.     Left lower leg: No edema.  Lymphadenopathy:     Cervical: No cervical adenopathy.     Upper Body:     Right upper body: No supraclavicular, axillary or pectoral adenopathy.     Left upper body: No supraclavicular, axillary or pectoral adenopathy.  Skin:    Capillary Refill: Capillary refill  takes less than 2 seconds.     Coloration: Skin is not jaundiced or pale.     Findings: No bruising, erythema, lesion or rash.  Neurological:     General: No focal deficit present.     Mental Status: She is alert and oriented to person, place,  and time.     Cranial Nerves: No cranial nerve deficit.     Sensory: No sensory deficit.     Motor: No weakness.     Coordination: Coordination normal.     Gait: Gait normal.     Deep Tendon Reflexes: Reflexes normal.  Psychiatric:        Mood and Affect: Mood normal.        Behavior: Behavior normal.        Thought Content: Thought content normal.        Judgment: Judgment normal.    Assessment & Plan:   Problem List Items Addressed This Visit   None   Outpatient Encounter Medications as of 03/08/2021  Medication Sig   Prenatal Vit-Fe Fumarate-FA (PRENATAL MULTIVITAMIN) TABS tablet Take 1 tablet by mouth daily.   [DISCONTINUED] aspirin 81 MG EC tablet Take 1 tablet (81 mg total) by mouth daily. Swallow whole. (Patient not taking: Reported on 10/12/2020)   [DISCONTINUED] ferrous sulfate 325 (65 FE) MG tablet Take 1 tablet (325 mg total) by mouth every other day. (Patient not taking: Reported on 11/09/2020)   [DISCONTINUED] ibuprofen (ADVIL) 600 MG tablet Take 1 tablet (600 mg total) by mouth every 6 (six) hours.   [DISCONTINUED] NIFEdipine (ADALAT CC) 30 MG 24 hr tablet Take 1 tablet (30 mg total) by mouth daily. (Patient not taking: Reported on 11/09/2020)   No facility-administered encounter medications on file as of 03/08/2021.    Follow-up: No follow-ups on file.   Donell Beers, FNP

## 2021-03-08 NOTE — Assessment & Plan Note (Addendum)
Intermittent epigastric pain radiating to the left upper quadrant.  Check labs today. Abdomen non tender, no masses palpated.

## 2021-03-08 NOTE — Assessment & Plan Note (Signed)
Importance of healthy food choices with portion control discussed as well as eating regularly within 12  hour window.   The need to choose clean green food 50%-75% of time is discussed as well as make water the primary drink and set a goal for 64 ounces daily.  Patient reeducated about the importance of committment to minimum of 150 minutes of exercise per week.  Three meals at set times with snacks allowed between meals but they must be fruit or vegetable.   Aim to eat  over 12 hour period  for example 7 am to 7 pm. Stop after your last meal of the day.  Wt Readings from Last 3 Encounters:  03/08/21 193 lb 0.6 oz (87.6 kg)  12/31/20 195 lb (88.5 kg)  11/09/20 200 lb 3.2 oz (90.8 kg)

## 2021-03-09 ENCOUNTER — Other Ambulatory Visit: Payer: Self-pay | Admitting: Nurse Practitioner

## 2021-03-09 DIAGNOSIS — Z23 Encounter for immunization: Secondary | ICD-10-CM | POA: Insufficient documentation

## 2021-03-09 DIAGNOSIS — E559 Vitamin D deficiency, unspecified: Secondary | ICD-10-CM

## 2021-03-09 DIAGNOSIS — R748 Abnormal levels of other serum enzymes: Secondary | ICD-10-CM

## 2021-03-09 LAB — CMP14+EGFR
ALT: 93 IU/L — ABNORMAL HIGH (ref 0–32)
AST: 39 IU/L (ref 0–40)
Albumin/Globulin Ratio: 1.5 (ref 1.2–2.2)
Albumin: 4.8 g/dL (ref 3.9–5.0)
Alkaline Phosphatase: 227 IU/L — ABNORMAL HIGH (ref 44–121)
BUN/Creatinine Ratio: 17 (ref 9–23)
BUN: 11 mg/dL (ref 6–20)
Bilirubin Total: 0.4 mg/dL (ref 0.0–1.2)
CO2: 23 mmol/L (ref 20–29)
Calcium: 9.9 mg/dL (ref 8.7–10.2)
Chloride: 101 mmol/L (ref 96–106)
Creatinine, Ser: 0.66 mg/dL (ref 0.57–1.00)
Globulin, Total: 3.2 g/dL (ref 1.5–4.5)
Glucose: 77 mg/dL (ref 70–99)
Potassium: 4 mmol/L (ref 3.5–5.2)
Sodium: 140 mmol/L (ref 134–144)
Total Protein: 8 g/dL (ref 6.0–8.5)
eGFR: 126 mL/min/{1.73_m2} (ref >59)

## 2021-03-09 LAB — LIPID PANEL
Chol/HDL Ratio: 2.4 ratio (ref 0.0–4.4)
Cholesterol, Total: 110 mg/dL (ref 100–199)
HDL: 46 mg/dL (ref >39)
LDL Chol Calc (NIH): 54 mg/dL (ref 0–99)
Triglycerides: 38 mg/dL (ref 0–149)
VLDL Cholesterol Cal: 10 mg/dL (ref 5–40)

## 2021-03-09 LAB — CBC WITH DIFFERENTIAL/PLATELET
Basophils Absolute: 0.1 10*3/uL (ref 0.0–0.2)
Basos: 1 %
EOS (ABSOLUTE): 0.1 10*3/uL (ref 0.0–0.4)
Eos: 2 %
Hematocrit: 36.2 % (ref 34.0–46.6)
Hemoglobin: 12.1 g/dL (ref 11.1–15.9)
Immature Grans (Abs): 0 10*3/uL (ref 0.0–0.1)
Immature Granulocytes: 0 %
Lymphocytes Absolute: 2.2 10*3/uL (ref 0.7–3.1)
Lymphs: 37 %
MCH: 26.2 pg — ABNORMAL LOW (ref 26.6–33.0)
MCHC: 33.4 g/dL (ref 31.5–35.7)
MCV: 78 fL — ABNORMAL LOW (ref 79–97)
Monocytes Absolute: 0.3 10*3/uL (ref 0.1–0.9)
Monocytes: 6 %
Neutrophils Absolute: 3.2 10*3/uL (ref 1.4–7.0)
Neutrophils: 54 %
Platelets: 415 10*3/uL (ref 150–450)
RBC: 4.62 x10E6/uL (ref 3.77–5.28)
RDW: 13.2 % (ref 11.7–15.4)
WBC: 5.9 10*3/uL (ref 3.4–10.8)

## 2021-03-09 LAB — HEMOGLOBIN A1C
Est. average glucose Bld gHb Est-mCnc: 108 mg/dL
Hgb A1c MFr Bld: 5.4 % (ref 4.8–5.6)

## 2021-03-09 LAB — VITAMIN D 25 HYDROXY (VIT D DEFICIENCY, FRACTURES): Vit D, 25-Hydroxy: 18.8 ng/mL — ABNORMAL LOW (ref 30.0–100.0)

## 2021-03-09 LAB — TSH: TSH: 1 u[IU]/mL (ref 0.450–4.500)

## 2021-03-09 MED ORDER — VITAMIN D3 25 MCG (1000 UT) PO CAPS: 1000.0000 [IU] | ORAL_CAPSULE | Freq: Every day | ORAL | 3 refills | Status: AC

## 2021-03-09 NOTE — Progress Notes (Signed)
Pls review results with pt.  Liver enzymes are still elevated, I have reordered ABD [place] of her liver and gallbladder, not sure why the one previously ordered was not done. Avoid tylenol, alcohol.   Vitamin D level is low, start taking vitamin d 1000 units daily.   Continue prenatal vitamins.   Other labs are normal.  Thanks

## 2021-03-09 NOTE — Assessment & Plan Note (Signed)
Patient educated on CDC recommendation for the vaccine. Verbal consent was obtained from the patient, vaccine administered by nurse, no sign of adverse reactions noted at this time. Patient education on arm soreness and use of tylenol or ibuprofen (if safe) for this patient  was discussed. Patient educated on the signs and symptoms of adverse effect and advise to contact the office if they occur °

## 2021-03-18 ENCOUNTER — Ambulatory Visit (HOSPITAL_COMMUNITY): Admission: RE | Admit: 2021-03-18 | Payer: Managed Care, Other (non HMO) | Source: Ambulatory Visit

## 2021-04-11 ENCOUNTER — Telehealth: Payer: Self-pay | Admitting: Nurse Practitioner

## 2021-04-11 NOTE — Telephone Encounter (Signed)
Patient called in requesting shot record. ? ?Patient will be by to pick up after lunch.  ?

## 2021-04-11 NOTE — Telephone Encounter (Signed)
Printed and placed up front for pick up.

## 2021-05-23 ENCOUNTER — Ambulatory Visit (INDEPENDENT_AMBULATORY_CARE_PROVIDER_SITE_OTHER): Payer: Managed Care, Other (non HMO) | Admitting: Nurse Practitioner

## 2021-05-23 ENCOUNTER — Encounter: Payer: Self-pay | Admitting: Nurse Practitioner

## 2021-05-23 DIAGNOSIS — H1013 Acute atopic conjunctivitis, bilateral: Secondary | ICD-10-CM

## 2021-05-23 MED ORDER — OLOPATADINE HCL 0.1 % OP SOLN: 1.0000 [drp] | Freq: Two times a day (BID) | OPHTHALMIC | 0 refills | Status: DC

## 2021-05-23 NOTE — Assessment & Plan Note (Addendum)
RX olopatidine eye drops ?Place 1 drop into both eyes 2 (two) times daily. ?Pt told to monitor her infant for any sign of milk rejection during use of med. ? ?

## 2021-05-23 NOTE — Progress Notes (Signed)
Virtual Visit via Telephone Note ? ?I connected with Alisha Norman @ on 05/23/21 at 1107am by telephone and verified that I am speaking with the correct person using two identifiers. I spent 6 minutes on this telephone encounter.  ? ?Location: ?Patient: home ?Provider: office ?  ?I discussed the limitations, risks, security and privacy concerns of performing an evaluation and management service by telephone and the availability of in person appointments. I also discussed with the patient that there may be a patient responsible charge related to this service. The patient expressed understanding and agreed to proceed. ? ? ?History of Present Illness: ? ?Patient with history of gestational hypertension, overweight c/o really bad allergies, has eye redness ,watery eyes, irritated eyes that's started 4 days ago denies stuffy nose, sneezing, stuffy nose, fever, chills, cough. She has not taken any medications.  ? ? ?Observations/Objective: ? ? ?Assessment and Plan: ?Allergic conjunctivitis of both eyes ?RX olopatidine eye drops ?Place 1 drop into both eyes 2 (two) times daily. ?Pt told to monitor her infant for any sign of milk rejection during use of med. ?  ? ?Follow Up Instructions: ? ?  ?I discussed the assessment and treatment plan with the patient. The patient was provided an opportunity to ask questions and all were answered. The patient agreed with the plan and demonstrated an understanding of the instructions. ?  ?The patient was advised to call back or seek an in-person evaluation if the symptoms worsen or if the condition fails to improve as anticipated.  ?

## 2021-09-16 ENCOUNTER — Ambulatory Visit (HOSPITAL_COMMUNITY): Payer: Medicaid Other

## 2022-01-06 ENCOUNTER — Ambulatory Visit: Payer: Medicaid Other | Admitting: Women's Health

## 2022-03-04 ENCOUNTER — Telehealth: Payer: Self-pay | Admitting: Women's Health

## 2022-03-04 NOTE — Telephone Encounter (Signed)
Patient just started period this year, She had cramps and bleeding for a week and then stopped but when her and her husband has sex, it restarts it. Please advise.

## 2022-03-10 ENCOUNTER — Ambulatory Visit: Payer: 59 | Admitting: Adult Health

## 2022-03-11 ENCOUNTER — Encounter: Payer: Managed Care, Other (non HMO) | Admitting: Nurse Practitioner

## 2022-03-13 ENCOUNTER — Ambulatory Visit: Payer: 59 | Admitting: Adult Health

## 2022-03-21 ENCOUNTER — Encounter: Payer: Managed Care, Other (non HMO) | Admitting: Family Medicine

## 2022-03-21 ENCOUNTER — Encounter: Payer: Self-pay | Admitting: Nurse Practitioner

## 2022-03-21 ENCOUNTER — Encounter: Payer: Self-pay | Admitting: Family Medicine

## 2022-06-27 ENCOUNTER — Encounter: Payer: 59 | Admitting: Family Medicine

## 2022-08-11 ENCOUNTER — Other Ambulatory Visit: Payer: Self-pay | Admitting: Family

## 2022-08-11 DIAGNOSIS — R7401 Elevation of levels of liver transaminase levels: Secondary | ICD-10-CM

## 2022-08-21 ENCOUNTER — Ambulatory Visit
Admission: RE | Admit: 2022-08-21 | Discharge: 2022-08-21 | Disposition: A | Discharge status: Home / Self Care | Payer: Medicaid Other | Source: Ambulatory Visit | Attending: Family | Admitting: Family

## 2022-08-21 DIAGNOSIS — R7401 Elevation of levels of liver transaminase levels: Secondary | ICD-10-CM

## 2022-12-02 ENCOUNTER — Ambulatory Visit: Payer: 59 | Admitting: Gastroenterology

## 2022-12-03 ENCOUNTER — Encounter (INDEPENDENT_AMBULATORY_CARE_PROVIDER_SITE_OTHER): Payer: Self-pay | Admitting: *Deleted

## 2022-12-10 ENCOUNTER — Ambulatory Visit (INDEPENDENT_AMBULATORY_CARE_PROVIDER_SITE_OTHER): Payer: Managed Care, Other (non HMO) | Admitting: Gastroenterology

## 2022-12-10 ENCOUNTER — Encounter (INDEPENDENT_AMBULATORY_CARE_PROVIDER_SITE_OTHER): Payer: Self-pay | Admitting: Gastroenterology

## 2022-12-10 VITALS — BP 157/82 | HR 109 | Temp 97.7°F | Ht 68.0 in | Wt 212.3 lb

## 2022-12-10 DIAGNOSIS — R7401 Elevation of levels of liver transaminase levels: Secondary | ICD-10-CM

## 2022-12-10 DIAGNOSIS — K760 Fatty (change of) liver, not elsewhere classified: Secondary | ICD-10-CM | POA: Diagnosis not present

## 2022-12-10 DIAGNOSIS — R748 Abnormal levels of other serum enzymes: Secondary | ICD-10-CM | POA: Diagnosis not present

## 2022-12-10 DIAGNOSIS — I10 Essential (primary) hypertension: Secondary | ICD-10-CM | POA: Insufficient documentation

## 2022-12-10 DIAGNOSIS — Z6832 Body mass index (BMI) 32.0-32.9, adult: Secondary | ICD-10-CM | POA: Insufficient documentation

## 2022-12-10 NOTE — Progress Notes (Signed)
Vista Lawman , M.D. Gastroenterology & Hepatology [facility] [address] [place], [place] [address] Primary Care Physician: Donell Beers, FNP [address], [address] [place] [address]  Chief Complaint:  elevated liver enzymes and fatty liver   History of Present Illness: Alisha Norman is a 22 y.o. female with no significant medical problems who presents for evaluation of elevated liver enzymes and fatty liver   Patient reports that she used to drink alcohol only occasionally.  Reports no family history of any liver disease except that mother also has fatty liver.  Patient denies any herbal medications or any new medications.  Denies any generalized itching or yellowing of the skin.The patient denies having any nausea, vomiting, fever, chills, hematochezia, melena, hematemesis, abdominal distention, abdominal pain, diarrhea, jaundice, pruritus or weight loss.  Last OVF:IEPP Last Colonoscopy:none  FHx: neg for any gastrointestinal/liver disease, no malignancies Social: neg smoking, occasional alcohol use Surgical: no abdominal surgeries  Past Medical History: Past Medical History:  Diagnosis Date   Amnesia    Concussion    Dermatitis    Enlarged liver    Headache    Hepatitis     Past Surgical History: Past Surgical History:  Procedure Laterality Date   NO PAST SURGERIES      Family History: Family History  Problem Relation Age of Onset   Hypertension Mother    Breast cancer Maternal Aunt    Diabetes Maternal Grandmother    Heart attack Paternal Grandfather    Colon cancer Neg Hx    Cervical cancer Neg Hx     Social History: Social History   Tobacco Use  Smoking Status Never  Smokeless Tobacco Never   Social History   Substance and Sexual Activity  Alcohol Use Not Currently   Comment: occasionally.   Social History   Substance and Sexual Activity  Drug Use Never     Allergies: Allergies  Allergen Reactions   Codeine Itching   Penicillins Itching and Rash    Mother and patient state that any "-cillins" will cause these reactions. Has patient had a PCN reaction causing immediate rash, facial/tongue/throat swelling, SOB or lightheadedness with hypotension: Yes Has patient had a PCN reaction causing severe rash involving mucus membranes or skin necrosis: No Has patient had a PCN reaction that required hospitalization No Has patient had a PCN reaction occurring within the last 10 years: Yes If all of the above answers are "NO", then may proceed with Cephal    Medications: Current Outpatient Medications  Medication Sig Dispense Refill   Cholecalciferol (VITAMIN D3) 25 MCG (1000 UT) CAPS Take 1 capsule (1,000 Units total) by mouth daily. 60 capsule 3   Multiple Vitamin (MULTIVITAMIN) tablet Take 1 tablet by mouth daily.     No current facility-administered medications for this visit.    Review of Systems: GENERAL: negative for malaise, night sweats HEENT: No changes in hearing or vision, no nose bleeds or other nasal problems. NECK: Negative for lumps, goiter, pain and significant neck swelling RESPIRATORY: Negative for cough, wheezing CARDIOVASCULAR: Negative for chest pain, leg swelling, palpitations, orthopnea GI: SEE HPI MUSCULOSKELETAL: Negative for joint pain or swelling, back pain, and muscle pain. SKIN: Negative for lesions, rash HEMATOLOGY Negative for prolonged bleeding, bruising easily, and swollen nodes. ENDOCRINE: Negative for cold or heat intolerance, polyuria, polydipsia and goiter. NEURO: negative for tremor, gait imbalance, syncope and seizures. The remainder of the review of systems is noncontributory.   Physical  Exam: BP (!) 157/82   Pulse (!) 109   Temp 97.7 F (36.5 C) (Oral)   Ht 5\' 8"  (1.727 m)   Wt 212 lb 4.8 oz (96.3 kg)   BMI 32.28 kg/m  GENERAL: The patient is AO x3, in no acute distress. HEENT: Head is  normocephalic and atraumatic. EOMI are intact. Mouth is well hydrated and without lesions. NECK: Supple. No masses LUNGS: Clear to auscultation. No presence of rhonchi/wheezing/rales. Adequate chest expansion HEART: RRR, normal s1 and s2. ABDOMEN: Soft, nontender, no guarding, no peritoneal signs, and nondistended. BS +. No masses.  Imaging/Labs: as above     Latest Ref Rng & Units 03/08/2021    9:22 AM 12/31/2020    9:47 PM 10/05/2020    4:57 AM  CBC  WBC 3.4 - 10.8 x10E3/uL 5.9  8.5  12.0   Hemoglobin 11.1 - 15.9 g/dL 21.3  08.6  8.9   Hematocrit 34.0 - 46.6 % 36.2  35.5  28.0   Platelets 150 - 450 x10E3/uL 415  486  313    No results found for: "IRON", "TIBC", "FERRITIN"  I personally reviewed and interpreted the available labs, imaging and endoscopic files.  Ultrasound 08/2022   IMPRESSION: 1. No cholelithiasis or sonographic evidence for acute cholecystitis. 2. Mild increased hepatic parenchymal echogenicity suggestive of steatosis.   Labs from 07/2022 GGT 142 ALT 50 AST 17 Alk phos 127 T. bili 0.2  From 11/2022 hep C negative HIV negative hepatitis B surface antigen negative  Hemoglobin A1c 5.6  Impression and Plan:  Alisha Norman is a 22 y.o. female with no significant medical problems who presents for evaluation of elevated liver enzymes and fatty liver   #  Elevated liver enzymes  This is likely due to MASLD   Fib 4 Score 0.14  Although Fib-4 interpretation is not validated for people under 35 or over 34 years of age.  On exam patient does not have signs of advanced chronic liver disease, no splenomegaly, ascites, spider angiomas, palmar eythema    Will evaluate for CSPH (clinically significant portal hypertension)/degree of fibrosis  with Fibroscan/elastrography    Given elevation in ALT and alk phos will obtain baseline viral hepatitis profile, AMA, and autoimmune serologies and elastography to assess degree of fibrosis  #BMI 32       - walking at  a brisk pace/biking at moderate intesity 2.5-5 hours per week     - use pedometer/step counter to track activity     - goal to lose 5-10% of initial body weight     - avoid suagry drinks and juices, use zero calorie beverages     - increase water intake     - eat a low carb diet with plenty of veggies and fruit     - Get sufficient sleep 7-8 hrs nightly     - maitain active lifestyle     - avoid alcohol     - recommend 2-3 cups Coffee daily     - Counsel on lowering cholesterol by having a diet rich in vegetables,          protein (avoid red meats) and good fats(fish, salmon).    #MASLD - Risk factors include BMI 33,HTN - Goal weight loss 22 lbs over the next year (2 lbs per month) - Low glycemic diet, aerobic exercise   #Hypertension The patient was found to have elevated blood pressure when vital signs were checked in the office. The blood pressure was rechecked  by the nursing staff and it was found be persistently elevated >140/90 mmHg. I personally advised to the patient to follow up closely with PCP for hypertension control.   All questions were answered.      Vista Lawman, MD Gastroenterology and Hepatology [facility]   This chart has been completed using [facility] Dictation software, and while attempts have been made to ensure accuracy , certain words and phrases may not be transcribed as intended

## 2022-12-10 NOTE — Patient Instructions (Addendum)
It was very nice to meet you today, as dicussed with will plan for the following :  1) Lab work and ultrasound  2)- walking at a brisk pace/biking at moderate intesity 2.5-5 hours per week     - use pedometer/step counter to track activity     - goal to lose 5-10% of initial body weight     - avoid suagry drinks and juices, use zero calorie beverages     - increase water intake     - eat a low carb diet with plenty of veggies and fruit     - Get sufficient sleep 7-8 hrs nightly     - maitain active lifestyle     - avoid alcohol     - recommend 2-3 cups Coffee daily     - Counsel on lowering cholesterol by having a diet rich in vegetables,          protein (avoid red meats) and good fats(fish, salmon).  3) Check BP at home and follow up with PCP

## 2022-12-12 LAB — NASH FIBROSURE(R) PLUS
ALPHA 2-MACROGLOBULINS, QN: 203 mg/dL (ref 110–276)
ALT (SGPT) P5P: 79 [IU]/L — ABNORMAL HIGH (ref 0–40)
AST (SGOT) P5P: 46 [IU]/L — ABNORMAL HIGH (ref 0–40)
Apolipoprotein A-1: 110 mg/dL — ABNORMAL LOW (ref 116–209)
Bilirubin, Total: 0.3 mg/dL (ref 0.0–1.2)
Cholesterol, Total: 116 mg/dL (ref 100–199)
Fibrosis Score: 0.16 (ref 0.00–0.21)
GGT: 138 [IU]/L — ABNORMAL HIGH (ref 0–60)
Glucose: 87 mg/dL (ref 70–99)
Haptoglobin: 176 mg/dL (ref 33–278)
NASH Score: 0.49 — ABNORMAL HIGH (ref 0.00–0.25)
Steatosis Score: 0.59 — ABNORMAL HIGH (ref 0.00–0.40)
Triglycerides: 81 mg/dL (ref 0–149)

## 2022-12-12 LAB — IRON,TIBC AND FERRITIN PANEL
Ferritin: 81 ng/mL (ref 15–150)
Iron Saturation: 16 % (ref 15–55)
Iron: 54 ug/dL (ref 27–159)
Total Iron Binding Capacity: 339 ug/dL (ref 250–450)
UIBC: 285 ug/dL (ref 131–425)

## 2022-12-12 LAB — PROTIME-INR
INR: 0.9 (ref 0.9–1.2)
Prothrombin Time: 10.6 s (ref 9.1–12.0)

## 2022-12-12 LAB — HEPATITIS B CORE ANTIBODY, TOTAL: Hep B Core Total Ab: NEGATIVE

## 2022-12-12 LAB — ANA: Anti Nuclear Antibody (ANA): NEGATIVE

## 2022-12-12 LAB — HEPATITIS B SURFACE ANTIBODY,QUALITATIVE: Hep B Surface Ab, Qual: REACTIVE

## 2022-12-12 LAB — HEPATITIS A ANTIBODY, TOTAL: hep A Total Ab: POSITIVE — AB

## 2022-12-12 LAB — ANTI-SMOOTH MUSCLE ANTIBODY, IGG: Smooth Muscle Ab: 8 U (ref 0–19)

## 2022-12-12 LAB — MITOCHONDRIAL ANTIBODIES: Mitochondrial Ab: <20 U (ref 0.0–20.0)

## 2022-12-15 ENCOUNTER — Encounter (INDEPENDENT_AMBULATORY_CARE_PROVIDER_SITE_OTHER): Payer: Self-pay | Admitting: *Deleted

## 2022-12-15 ENCOUNTER — Ambulatory Visit (HOSPITAL_COMMUNITY)
Admission: RE | Admit: 2022-12-15 | Discharge: 2022-12-15 | Disposition: A | Discharge status: Home / Self Care | Payer: Managed Care, Other (non HMO) | Source: Ambulatory Visit | Attending: Gastroenterology | Admitting: Gastroenterology

## 2022-12-15 DIAGNOSIS — R748 Abnormal levels of other serum enzymes: Secondary | ICD-10-CM | POA: Diagnosis present

## 2022-12-15 DIAGNOSIS — K760 Fatty (change of) liver, not elsewhere classified: Secondary | ICD-10-CM | POA: Diagnosis present

## 2022-12-15 NOTE — Progress Notes (Signed)
Hi Alisha Norman ,  Can you please call the patient and tell the patient the lab work suggest no fibrosis or scarring of the liver but shows fatty liver which is likely causing your liver enzymes to be elevated.  This is a good news.  I recommend following up with your primary care for vaccination against hepatitis B and continue with lifestyle changes with healthy diet and exercise in order to lose weight  Thanks,  Vista Lawman, MD Gastroenterology and Hepatology [facility]  =============  Normal ferritin:81 Negative ANA ASMA, AMA Normal INR Hepatitis A immune.  Hepatitis B nonexposed nonimmune Manage FibroSure results with no fibrosis steatosis grade S2-S3 correlates to moderate to severe steatosis.  Mild Alisha Norman

## 2022-12-24 NOTE — Progress Notes (Signed)
Hi Wendy ,  Can you please call the patient and tell the patient the ultrasound shows fatty liver disease but did not suggest any advanced fibrosis which is a good news  Thanks,  Vista Lawman, MD Gastroenterology and Hepatology [facility]

## 2023-06-28 ENCOUNTER — Emergency Department (HOSPITAL_BASED_OUTPATIENT_CLINIC_OR_DEPARTMENT_OTHER): Payer: PRIVATE HEALTH INSURANCE

## 2023-06-28 ENCOUNTER — Emergency Department (HOSPITAL_BASED_OUTPATIENT_CLINIC_OR_DEPARTMENT_OTHER)
Admission: EM | Admit: 2023-06-28 | Discharge: 2023-06-29 | Disposition: A | Discharge status: Home / Self Care | Payer: PRIVATE HEALTH INSURANCE | Attending: Emergency Medicine | Admitting: Emergency Medicine

## 2023-06-28 ENCOUNTER — Other Ambulatory Visit: Payer: Self-pay

## 2023-06-28 ENCOUNTER — Encounter (HOSPITAL_BASED_OUTPATIENT_CLINIC_OR_DEPARTMENT_OTHER): Payer: Self-pay

## 2023-06-28 DIAGNOSIS — R059 Cough, unspecified: Secondary | ICD-10-CM | POA: Diagnosis not present

## 2023-06-28 DIAGNOSIS — J189 Pneumonia, unspecified organism: Secondary | ICD-10-CM | POA: Diagnosis not present

## 2023-06-28 DIAGNOSIS — R0602 Shortness of breath: Secondary | ICD-10-CM | POA: Diagnosis present

## 2023-06-28 LAB — BASIC METABOLIC PANEL WITH GFR
Anion gap: 15 (ref 5–15)
BUN: 14 mg/dL (ref 6–20)
CO2: 21 mmol/L — ABNORMAL LOW (ref 22–32)
Calcium: 9.8 mg/dL (ref 8.9–10.3)
Chloride: 100 mmol/L (ref 98–111)
Creatinine, Ser: 0.99 mg/dL (ref 0.44–1.00)
GFR, Estimated: >60 mL/min (ref >60)
Glucose, Bld: 89 mg/dL (ref 70–99)
Potassium: 3.8 mmol/L (ref 3.5–5.1)
Sodium: 136 mmol/L (ref 135–145)

## 2023-06-28 LAB — TROPONIN T, HIGH SENSITIVITY
Troponin T High Sensitivity: <15 ng/L (ref <19)
Troponin T High Sensitivity: <15 ng/L (ref <19)

## 2023-06-28 LAB — RESP PANEL BY RT-PCR (RSV, FLU A&B, COVID)  RVPGX2
Influenza A by PCR: NEGATIVE
Influenza B by PCR: NEGATIVE
Resp Syncytial Virus by PCR: NEGATIVE
SARS Coronavirus 2 by RT PCR: NEGATIVE

## 2023-06-28 LAB — CBC
HCT: 35.1 % — ABNORMAL LOW (ref 36.0–46.0)
Hemoglobin: 11.5 g/dL — ABNORMAL LOW (ref 12.0–15.0)
MCH: 27 pg (ref 26.0–34.0)
MCHC: 32.8 g/dL (ref 30.0–36.0)
MCV: 82.4 fL (ref 80.0–100.0)
Platelets: 318 10*3/uL (ref 150–400)
RBC: 4.26 MIL/uL (ref 3.87–5.11)
RDW: 12.7 % (ref 11.5–15.5)
WBC: 6.7 10*3/uL (ref 4.0–10.5)
nRBC: 0 % (ref 0.0–0.2)

## 2023-06-28 LAB — PREGNANCY, URINE: Preg Test, Ur: NEGATIVE

## 2023-06-28 LAB — D-DIMER, QUANTITATIVE: D-Dimer, Quant: 0.78 ug{FEU}/mL — ABNORMAL HIGH (ref 0.00–0.50)

## 2023-06-28 MED ORDER — AZITHROMYCIN 250 MG PO TABS
500.0000 mg | ORAL_TABLET | Freq: Once | ORAL | Status: AC
  Administered 2023-06-28: 500 mg via ORAL
  Filled 2023-06-28: qty 2

## 2023-06-28 MED ORDER — AZITHROMYCIN 250 MG PO TABS: 250.0000 mg | ORAL_TABLET | Freq: Every day | ORAL | 0 refills | Status: AC

## 2023-06-28 MED ORDER — ACETAMINOPHEN 500 MG PO TABS
1000.0000 mg | ORAL_TABLET | Freq: Once | ORAL | Status: AC
  Administered 2023-06-28: 1000 mg via ORAL
  Filled 2023-06-28: qty 2

## 2023-06-28 MED ORDER — SODIUM CHLORIDE 0.9 % IV BOLUS
1000.0000 mL | Freq: Once | INTRAVENOUS | Status: AC
  Administered 2023-06-28: 1000 mL via INTRAVENOUS

## 2023-06-28 MED ORDER — IOHEXOL 350 MG/ML SOLN
100.0000 mL | Freq: Once | INTRAVENOUS | Status: AC | PRN
  Administered 2023-06-28: 100 mL via INTRAVENOUS

## 2023-06-28 MED ORDER — IOHEXOL 350 MG/ML SOLN
75.0000 mL | Freq: Once | INTRAVENOUS | Status: AC | PRN
  Administered 2023-06-28: 75 mL via INTRAVENOUS

## 2023-06-28 NOTE — Discharge Instructions (Addendum)
 You were seen in the emerged department for coughing chest discomfort and a fast heart rate CAT scan did not show evidence of blood clot in your lungs but did show pneumonia For this reason we gave your first dose of antibiotics here Please pick up the rest of the prescribed antibiotics from your pharmacy [facility] in [place] and begin taking as directed for the next 5 days Follow-up with your primary care doctor Return to the emergency room for trouble breathing or any other concerns Take Tylenol  Motrin  as directed for fevers and discomfort

## 2023-06-28 NOTE — ED Notes (Signed)
 ED Provider at bedside.

## 2023-06-28 NOTE — ED Provider Notes (Signed)
 [facility] EMERGENCY DEPARTMENT AT [facility] Provider Note   CSN: [id number] Arrival date & time: 06/28/23  1854     History  Chief Complaint  Patient presents with   Cough   Shortness of Breath    Alisha Norman is a 23 y.o. female.  With no pertinent past medical history presented to ED for shortness of breath.  Her daughter has been sick at home with recent viral respiratory illness.  Her symptoms began about 6 days ago with cough congestion chest tightness.  Feels short of breath with minimal exertion.  No fevers nausea vomiting or other complaint at this time.  Has contraceptive implant.  No exogenous estrogen.  No prior history of blood clots recent surgery.   Cough Associated symptoms: shortness of breath   Shortness of Breath Associated symptoms: cough        Home Medications Prior to Admission medications   Medication Sig Start Date End Date Taking? Authorizing Provider  azithromycin  (ZITHROMAX ) 250 MG tablet Take 1 tablet (250 mg total) by mouth daily for 5 days. You received your first dose of azithromycin  in the emergency department.  Take 1 tablet every day until finished. 06/28/23 07/03/23 Yes Sallyanne Creamer, DO  Cholecalciferol (VITAMIN D3) 25 MCG (1000 UT) CAPS Take 1 capsule (1,000 Units total) by mouth daily. 03/09/21   Paseda, Folashade R, FNP  Multiple Vitamin (MULTIVITAMIN) tablet Take 1 tablet by mouth daily.    [provider]      Allergies    Codeine  and Penicillins    Review of Systems   Review of Systems  Respiratory:  Positive for cough and shortness of breath.     Physical Exam Updated Vital Signs BP (!) 148/87   Pulse (!) 113   Temp 98.8 F (37.1 C) (Oral)   Resp 20   Ht 5\' 8"  (1.727 m)   Wt 98.4 kg   SpO2 97%   Breastfeeding No   BMI 32.99 kg/m  Physical Exam Vitals and nursing note reviewed.  HENT:     Head: Normocephalic and atraumatic.  Eyes:     Pupils: Pupils are equal, round, and reactive to light.   Cardiovascular:     Rate and Rhythm: Normal rate and regular rhythm.  Pulmonary:     Effort: Pulmonary effort is normal.     Breath sounds: Normal breath sounds.  Abdominal:     Palpations: Abdomen is soft.     Tenderness: There is no abdominal tenderness.  Skin:    General: Skin is warm and dry.  Neurological:     Mental Status: She is alert.  Psychiatric:        Mood and Affect: Mood normal.     ED Results / Procedures / Treatments   Labs (all labs ordered are listed, but only abnormal results are displayed) Labs Reviewed  BASIC METABOLIC PANEL WITH GFR - Abnormal; Notable for the following components:      Result Value   CO2 21 (*)    All other components within normal limits  CBC - Abnormal; Notable for the following components:   Hemoglobin 11.5 (*)    HCT 35.1 (*)    All other components within normal limits  D-DIMER, QUANTITATIVE - Abnormal; Notable for the following components:   D-Dimer, Quant 0.78 (*)    All other components within normal limits  RESP PANEL BY RT-PCR (RSV, FLU A&B, COVID)  RVPGX2  PREGNANCY, URINE  TROPONIN T, HIGH SENSITIVITY  TROPONIN T,  HIGH SENSITIVITY    EKG Date/Time:  Sunday Jun 28 2023 19:01:05 EDT Ventricular Rate:  133 PR Interval:  150 QRS Duration:  80 QT Interval:  370 QTC Calculation: 550 R Axis:   91  Text Interpretation:  Critical Test Result: Long QTc Sinus tachycardia Rightward axis Borderline ECG When compared with ECG of 09-Apr-2020 15:33, No significant change was found Confirmed by Rafael Bun [phone]) on 06/28/2023 8:26:53 PM  Radiology CT Angio Chest PE W and/or Wo Contrast Result Date: 06/28/2023 CLINICAL DATA:  Worsening cough and chest pain over the past several days, initial encounter EXAM: CT ANGIOGRAPHY CHEST WITH CONTRAST TECHNIQUE: Multidetector CT imaging of the chest was performed using the standard protocol during bolus administration of intravenous contrast. Multiplanar CT image reconstructions and  MIPs were obtained to evaluate the vascular anatomy. RADIATION DOSE REDUCTION: This exam was performed according to the departmental dose-optimization program which includes automated exposure control, adjustment of the mA and/or kV according to patient size and/or use of iterative reconstruction technique. CONTRAST:  75mL OMNIPAQUE  IOHEXOL  350 MG/ML SOLN, OMNIPAQUE  IOHEXOL  350 MG/ML SOLN COMPARISON:  Chest x-ray from earlier in the same day. FINDINGS: Cardiovascular: Thoracic aorta is within normal limits. No cardiac enlargement is noted. The pulmonary artery shows no definitive pulmonary emboli. The far peripheral branches are not well appreciated due to the timing of the contrast bolus. Mediastinum/Nodes: Thoracic inlet is within normal limits. No hilar or mediastinal adenopathy is noted. The esophagus as visualized is within normal limits. Lungs/Pleura: Lungs are well aerated bilaterally. Some patchy reticulonodular changes are seen in the right lower lobe consistent with acute infiltrate. No sizable effusion is noted. Similar but less prominent changes are noted in the medial aspect of the left lower lobe. Upper Abdomen: Visualized upper abdomen is within normal limits. Musculoskeletal: No chest wall abnormality. No acute or significant osseous findings. Review of the MIP images confirms the above findings. IMPRESSION: Patchy airspace opacity in the lower lobes bilaterally consistent with acute infiltrate. No evidence of pulmonary embolism although the examination is slightly limited in the far peripheral branches. Electronically Signed   By: Violeta Grey M.D.   On: 06/28/2023 22:12   DG Chest Port 1 View Result Date: 06/28/2023 CLINICAL DATA:  Shortness of breath.  Worsening cough. EXAM: PORTABLE CHEST 1 VIEW COMPARISON:  02/21/2016 FINDINGS: The cardiomediastinal contours are normal. The lungs are clear. Pulmonary vasculature is normal. No consolidation, pleural effusion, or pneumothorax. No acute  osseous abnormalities are seen. IMPRESSION: No active disease. Electronically Signed   By: Chadwick Colonel M.D.   On: 06/28/2023 20:06    Procedures Procedures    Medications Ordered in ED Medications  iohexol  (OMNIPAQUE ) 350 MG/ML injection 75 mL (75 mLs Intravenous Contrast Given 06/28/23 2204)  iohexol  (OMNIPAQUE ) 350 MG/ML injection 100 mL (100 mLs Intravenous Contrast Given 06/28/23 2205)  sodium chloride  0.9 % bolus 1,000 mL (1,000 mLs Intravenous New Bag/Given 06/28/23 2323)  acetaminophen  (TYLENOL ) tablet 1,000 mg (1,000 mg Oral Given 06/28/23 2343)  azithromycin  (ZITHROMAX ) tablet 500 mg (500 mg Oral Given 06/28/23 2344)    ED Course/ Medical Decision Making/ A&P Clinical Course as of 06/29/23 0005  Sun Jun 28, 2023  2126 D-dimer elevated.  Patient mains tachycardic.  Will obtain CT a chest to evaluate for PE.  Blood pressure has remained stable [MP]  2336 No pulmonary embolism on CTA but CTA does show bilateral infiltrates concerning for community-acquired pneumonia.  Most likely cause of her tachycardia would be acute infectious process  of pneumonia.  Will provide IV fluids here for her tachycardia give her a dose of Tylenol  and start her on p.o. antibiotics.  She has a documented penicillin allergy.  Will treat with azithromycin  [MP]  Mon Jun 29, 2023  0004 Tachycardia has improved after IV fluids Tylenol  and first dose of antibiotics here.  Appropriate for discharge with outpatient treatment for community-acquired pneumonia [MP]    Clinical Course User Index [MP] Sallyanne Creamer, DO                                 Medical Decision Making 23 year old female with history as above presenting to the ED for chest pain shortness of breath x 6 days.  Some coughing chest pain made worse with coughing.  Benign physical exam.  Vital signs notable for tachycardia 140s initially.  Improved to 110s after resting here.  Differential diagnosis includes ACS, pulmonary embolism, dysrhythmia,  pneumonia viral aspiratory illness.  Will obtain laboratory workup including D-dimer troponin chest x-ray pregnancy test.  If initial troponin elevated will obtain delta.  If D-dimer initially elevated will obtain CTA to evaluate for PE  Amount and/or Complexity of Data Reviewed Labs: ordered. Radiology: ordered.  Risk OTC drugs. Prescription drug management.           Final Clinical Impression(s) / ED Diagnoses Final diagnoses:  Community acquired pneumonia, unspecified laterality    Rx / DC Orders ED Discharge Orders          Ordered    azithromycin  (ZITHROMAX ) 250 MG tablet  Daily        06/28/23 2338              Sallyanne Creamer, DO 06/29/23 0006

## 2023-06-28 NOTE — ED Triage Notes (Addendum)
 Patient arrives POV with complaints of worsening cough, chest pain, and shortness of breath x4 days. Rates pain a 7/10. Sick exposure at home.

## 2023-09-16 ENCOUNTER — Emergency Department (HOSPITAL_BASED_OUTPATIENT_CLINIC_OR_DEPARTMENT_OTHER): Payer: PRIVATE HEALTH INSURANCE

## 2023-09-16 ENCOUNTER — Emergency Department (HOSPITAL_BASED_OUTPATIENT_CLINIC_OR_DEPARTMENT_OTHER)
Admission: EM | Admit: 2023-09-16 | Discharge: 2023-09-16 | Disposition: A | Discharge status: Home / Self Care | Payer: PRIVATE HEALTH INSURANCE | Attending: Emergency Medicine | Admitting: Emergency Medicine

## 2023-09-16 ENCOUNTER — Other Ambulatory Visit: Payer: Self-pay

## 2023-09-16 ENCOUNTER — Encounter (HOSPITAL_BASED_OUTPATIENT_CLINIC_OR_DEPARTMENT_OTHER): Payer: Self-pay

## 2023-09-16 DIAGNOSIS — R112 Nausea with vomiting, unspecified: Secondary | ICD-10-CM | POA: Insufficient documentation

## 2023-09-16 DIAGNOSIS — R1011 Right upper quadrant pain: Secondary | ICD-10-CM | POA: Diagnosis not present

## 2023-09-16 DIAGNOSIS — R197 Diarrhea, unspecified: Secondary | ICD-10-CM | POA: Insufficient documentation

## 2023-09-16 DIAGNOSIS — R1013 Epigastric pain: Secondary | ICD-10-CM | POA: Insufficient documentation

## 2023-09-16 LAB — CBC
HCT: 38.7 % (ref 36.0–46.0)
Hemoglobin: 12.6 g/dL (ref 12.0–15.0)
MCH: 27 pg (ref 26.0–34.0)
MCHC: 32.6 g/dL (ref 30.0–36.0)
MCV: 83 fL (ref 80.0–100.0)
Platelets: 439 K/uL — ABNORMAL HIGH (ref 150–400)
RBC: 4.66 MIL/uL (ref 3.87–5.11)
RDW: 12.7 % (ref 11.5–15.5)
WBC: 5.5 K/uL (ref 4.0–10.5)
nRBC: 0 % (ref 0.0–0.2)

## 2023-09-16 LAB — COMPREHENSIVE METABOLIC PANEL WITH GFR
ALT: 377 U/L — ABNORMAL HIGH (ref 0–44)
AST: 175 U/L — ABNORMAL HIGH (ref 15–41)
Albumin: 4.8 g/dL (ref 3.5–5.0)
Alkaline Phosphatase: 246 U/L — ABNORMAL HIGH (ref 38–126)
Anion gap: 12 (ref 5–15)
BUN: 7 mg/dL (ref 6–20)
CO2: 23 mmol/L (ref 22–32)
Calcium: 10.2 mg/dL (ref 8.9–10.3)
Chloride: 103 mmol/L (ref 98–111)
Creatinine, Ser: 0.89 mg/dL (ref 0.44–1.00)
GFR, Estimated: >60 mL/min (ref >60)
Glucose, Bld: 100 mg/dL — ABNORMAL HIGH (ref 70–99)
Potassium: 3.9 mmol/L (ref 3.5–5.1)
Sodium: 138 mmol/L (ref 135–145)
Total Bilirubin: 0.6 mg/dL (ref 0.0–1.2)
Total Protein: 8.9 g/dL — ABNORMAL HIGH (ref 6.5–8.1)

## 2023-09-16 LAB — URINALYSIS, ROUTINE W REFLEX MICROSCOPIC
Bilirubin Urine: NEGATIVE
Glucose, UA: NEGATIVE mg/dL
Hgb urine dipstick: NEGATIVE
Ketones, ur: NEGATIVE mg/dL
Leukocytes,Ua: NEGATIVE
Nitrite: NEGATIVE
Specific Gravity, Urine: 1.018 (ref 1.005–1.030)
pH: 6 (ref 5.0–8.0)

## 2023-09-16 LAB — LIPASE, BLOOD: Lipase: 16 U/L (ref 11–51)

## 2023-09-16 LAB — PREGNANCY, URINE: Preg Test, Ur: NEGATIVE

## 2023-09-16 MED ORDER — ONDANSETRON HCL 4 MG PO TABS: 4.0000 mg | ORAL_TABLET | Freq: Four times a day (QID) | ORAL | 0 refills | Status: AC

## 2023-09-16 MED ORDER — IOHEXOL 300 MG/ML  SOLN
100.0000 mL | Freq: Once | INTRAMUSCULAR | Status: AC | PRN
  Administered 2023-09-16: 85 mL via INTRAVENOUS

## 2023-09-16 MED ORDER — PANTOPRAZOLE SODIUM 40 MG IV SOLR
40.0000 mg | Freq: Once | INTRAVENOUS | Status: AC
Start: 2023-09-16 — End: 2023-09-16
  Administered 2023-09-16: 40 mg via INTRAVENOUS
  Filled 2023-09-16: qty 10

## 2023-09-16 MED ORDER — SODIUM CHLORIDE 0.9 % IV BOLUS
1000.0000 mL | Freq: Once | INTRAVENOUS | Status: AC
  Administered 2023-09-16: 1000 mL via INTRAVENOUS

## 2023-09-16 MED ORDER — ONDANSETRON HCL 4 MG/2ML IJ SOLN
4.0000 mg | Freq: Once | INTRAMUSCULAR | Status: AC
  Administered 2023-09-16: 4 mg via INTRAVENOUS
  Filled 2023-09-16: qty 2

## 2023-09-16 MED ORDER — HYDROMORPHONE HCL 1 MG/ML IJ SOLN: 1.0000 mg | Freq: Once | INTRAMUSCULAR | Status: DC

## 2023-09-16 MED ORDER — HYDROMORPHONE HCL 1 MG/ML IJ SOLN
0.5000 mg | Freq: Once | INTRAMUSCULAR | Status: AC
  Administered 2023-09-16: 0.5 mg via INTRAVENOUS
  Filled 2023-09-16: qty 1

## 2023-09-16 MED ORDER — FENTANYL CITRATE PF 50 MCG/ML IJ SOSY
50.0000 ug | PREFILLED_SYRINGE | Freq: Once | INTRAMUSCULAR | Status: AC
  Administered 2023-09-16: 50 ug via INTRAVENOUS
  Filled 2023-09-16: qty 1

## 2023-09-16 NOTE — ED Triage Notes (Signed)
 Patient reports nausea, vomiting, diarrhea and abdominal pain since Sunday. She thought it was food poisoning from [employer] but her symptoms are still continuing. Patient reports history of fatty liver but no other abdominal surgeries or history.

## 2023-09-16 NOTE — Discharge Instructions (Signed)
 Your laboratory results showed elevation on your liver enzymes.   I have prescribed zofran  to help with nausea, please take this medication as directed.  If you experience any fever, vomiting or worsening pain please return to the ED.

## 2023-09-16 NOTE — ED Notes (Signed)
 Dc paperwork given and verbally understood.Alisha AasAaron Norman

## 2023-09-16 NOTE — ED Notes (Signed)
 Pt given fluid fr PO challenge, per PA.[email or web address]

## 2023-09-16 NOTE — ED Provider Notes (Signed)
 [place] EMERGENCY DEPARTMENT AT [facility] Provider Note   CSN: [id number] Arrival date & time: 09/16/23  9094     Patient presents with: Abdominal Pain, Emesis, and Diarrhea   Alisha Norman is a 23 y.o. female.   23 y.o female with no PMH presents to the ED with a chief complaint of nausea, vomiting, and abdominal pain x 4 days.  Patient reports she ate Alisha Norman on Saturday night, has been having nausea, vomiting and multiple episodes of diarrhea.  She has not seen any blood in her stool.  She has tried to take Tums, antidiarrhea pills, without any improvement in her symptoms.  She endorses some cramping epigastric pain radiating to the upper abdomen.  She tried to take eat some chicken little soup last night, reports subsequently vomiting this. Exacerbated with oral intake, no alleviating factors. She did speak to her RN on call, who recommended supportive treatment but patient feels she is not feeling better. No gynecological complaints, no urinary symptoms or fever.    The history is provided by the patient.  Abdominal Pain Pain location:  Epigastric Pain quality: cramping   Pain radiates to:  Does not radiate Pain severity:  Moderate Onset quality:  Gradual Duration:  4 days Timing:  Constant Progression:  Unchanged Chronicity:  New Context: suspicious food intake   Context: not alcohol use, not previous surgeries and not sick contacts   Relieved by:  Nothing Worsened by:  Eating Ineffective treatments:  None tried Associated symptoms: diarrhea, nausea and vomiting   Associated symptoms: no chest pain, no chills, no fever, no shortness of breath and no sore throat   Emesis Associated symptoms: abdominal pain and diarrhea   Associated symptoms: no chills, no fever and no sore throat   Diarrhea Associated symptoms: abdominal pain and vomiting   Associated symptoms: no chills and no fever        Prior to Admission medications   Medication Sig Start Date  End Date Taking? Authorizing Provider  ondansetron  (ZOFRAN ) 4 MG tablet Take 1 tablet (4 mg total) by mouth every 6 (six) hours for 5 days. 09/16/23 09/21/23 Yes Tino Ronan, PA-C  Cholecalciferol (VITAMIN D3) 25 MCG (1000 UT) CAPS Take 1 capsule (1,000 Units total) by mouth daily. 03/09/21   Paseda, Folashade R, FNP  Multiple Vitamin (MULTIVITAMIN) tablet Take 1 tablet by mouth daily.    [provider]    Allergies: Codeine  and Penicillins    Review of Systems  Constitutional:  Negative for chills and fever.  HENT:  Negative for sore throat.   Respiratory:  Negative for shortness of breath.   Cardiovascular:  Negative for chest pain.  Gastrointestinal:  Positive for abdominal pain, diarrhea, nausea and vomiting. Negative for blood in stool.  Genitourinary:  Negative for flank pain.  Musculoskeletal:  Negative for back pain.  All other systems reviewed and are negative.   Updated Vital Signs BP 114/68 (BP Location: Right Arm)   Pulse 99   Temp 98.2 F (36.8 C) (Oral)   Resp 16   SpO2 100%   Physical Exam Vitals and nursing note reviewed.  Constitutional:      General: She is not in acute distress.    Appearance: She is well-developed.  HENT:     Head: Normocephalic and atraumatic.     Mouth/Throat:     Pharynx: No oropharyngeal exudate.  Eyes:     Pupils: Pupils are equal, round, and reactive to light.  Cardiovascular:  Rate and Rhythm: Regular rhythm.     Heart sounds: Normal heart sounds.  Pulmonary:     Effort: Pulmonary effort is normal. No respiratory distress.     Breath sounds: Normal breath sounds.  Abdominal:     General: Bowel sounds are normal. There is no distension.     Palpations: Abdomen is soft.     Tenderness: There is abdominal tenderness in the right upper quadrant and epigastric area. There is guarding. There is no right CVA tenderness or left CVA tenderness. Positive signs include Murphy's sign.  Musculoskeletal:        General: No  tenderness or deformity.     Cervical back: Normal range of motion.     Right lower leg: No edema.     Left lower leg: No edema.  Skin:    General: Skin is warm and dry.  Neurological:     Mental Status: She is alert and oriented to person, place, and time.     (all labs ordered are listed, but only abnormal results are displayed) Labs Reviewed  COMPREHENSIVE METABOLIC PANEL WITH GFR - Abnormal; Notable for the following components:      Result Value   Glucose, Bld 100 (*)    Total Protein 8.9 (*)    AST 175 (*)    ALT 377 (*)    Alkaline Phosphatase 246 (*)    All other components within normal limits  CBC - Abnormal; Notable for the following components:   Platelets 439 (*)    All other components within normal limits  URINALYSIS, ROUTINE W REFLEX MICROSCOPIC - Abnormal; Notable for the following components:   Protein, ur TRACE (*)    All other components within normal limits  LIPASE, BLOOD  PREGNANCY, URINE    EKG: None  Radiology: CT ABDOMEN PELVIS W CONTRAST Result Date: 09/16/2023 CLINICAL DATA:  Abdominal pain, nausea, vomiting EXAM: CT ABDOMEN AND PELVIS WITH CONTRAST TECHNIQUE: Multidetector CT imaging of the abdomen and pelvis was performed using the standard protocol following bolus administration of intravenous contrast. RADIATION DOSE REDUCTION: This exam was performed according to the departmental dose-optimization program which includes automated exposure control, adjustment of the mA and/or kV according to patient size and/or use of iterative reconstruction technique. CONTRAST:  85mL OMNIPAQUE  IOHEXOL  300 MG/ML  SOLN COMPARISON:  04/30/2012 FINDINGS: Lower chest: No acute abnormality Hepatobiliary: No focal hepatic abnormality. Gallbladder unremarkable. Pancreas: No focal abnormality or ductal dilatation. Spleen: No focal abnormality.  Normal size. Adrenals/Urinary Tract: No adrenal abnormality. No focal renal abnormality. No stones or hydronephrosis. Urinary  bladder is unremarkable. Stomach/Bowel: Normal appendix. Stomach, large and small bowel grossly unremarkable. Vascular/Lymphatic: No evidence of aneurysm or adenopathy. Reproductive: Uterus and adnexa unremarkable.  No mass. Other: No free fluid or free air. Musculoskeletal: No acute bony abnormality. IMPRESSION: No acute findings in the abdomen or pelvis. Electronically Signed   By: Franky Crease M.D.   On: 09/16/2023 13:54   US  Abdomen Limited Result Date: 09/16/2023 CLINICAL DATA:  Right upper quadrant pain. EXAM: ULTRASOUND ABDOMEN LIMITED RIGHT UPPER QUADRANT COMPARISON:  None Available. FINDINGS: Gallbladder: Minimal nonshadowing echogenic sludge. No wall thickening or sonographic Murphy sign. Common bile duct: Diameter: 6 mm, within normal limits. No intrahepatic biliary ductal dilatation. Liver: No focal lesion identified. Within normal limits in parenchymal echogenicity. Portal vein is patent on color Doppler imaging with normal direction of blood flow towards the liver. Other: None. IMPRESSION: Minimal gallbladder sludge.  No acute findings. Electronically Signed   By: Newell  Blietz M.D.   On: 09/16/2023 11:44     Procedures   Medications Ordered in the ED  sodium chloride  0.9 % bolus 1,000 mL (0 mLs Intravenous Stopped 09/16/23 1139)  ondansetron  (ZOFRAN ) injection 4 mg (4 mg Intravenous Given 09/16/23 0954)  fentaNYL  (SUBLIMAZE ) injection 50 mcg (50 mcg Intravenous Given 09/16/23 0955)  pantoprazole  (PROTONIX ) injection 40 mg (40 mg Intravenous Given 09/16/23 0955)  iohexol  (OMNIPAQUE ) 300 MG/ML solution 100 mL (85 mLs Intravenous Contrast Given 09/16/23 1213)  HYDROmorphone  (DILAUDID ) injection 0.5 mg (0.5 mg Intravenous Given 09/16/23 1225)                                    Medical Decision Making Amount and/or Complexity of Data Reviewed Labs: ordered. Radiology: ordered.  Risk Prescription drug management.   This patient presents to the ED for concern of nausea, vomiting, diarrhea,  this involves a number of treatment options, and is a complaint that carries with it a high risk of complications and morbidity.  The differential diagnosis includes cholecystitis, reflux, viral illness.   Co morbidities: Discussed in HPI   Brief History:  See HPI.   EMR reviewed including pt PMHx, past surgical history and past visits to ER.   See HPI for more details  Lab Tests:  I ordered and independently interpreted labs.  The pertinent results include:    Labs notable for CBC with no leukocytosis, hemoglobin is within normal limits.  CMP without any electrolyte derangement, creatinine levels unremarkable.  LFTs are significant elevated today with an AST at 175, ALT 377 denies any history of alcohol abuse.  Alk phos is also elevated at 214.  Lipase levels normal.  Imaging Studies:  US  Abdomen limited: IMPRESSION:  Minimal gallbladder sludge.  No acute findings.   CT Abdomen and pelvis showed: No acute findings  Medicines ordered:  I ordered medication including zofran , protonix , bolus  for symptomatic treatment  Reevaluation of the patient after these medicines showed that the patient improved I have reviewed the patients home medicines and have made adjustments as needed  Consults:  I requested consultation with Ozell Crock APP of general surgery,  and discussed lab and imaging findings as well as pertinent plan - they recommend: Pain control, p.o. challenge and further evaluation.  Reevaluation:  After the interventions noted above I re-evaluated patient and found that they have :resolved  Social Determinants of Health:  The patient's social determinants of health were a factor in the care of this patient  Problem List / ED Course:  Patient presented to the ED with 4 days of nausea, vomiting, diarrhea.  Symptoms are exacerbated with oral intake, no alleviating factors despite multiple medications such as Tums, antidiarrheal medication.  Having significant  pain along the upper abdomen epigastric region and right upper quadrant, strong suspicion for cholecystitis at this time.  CBC is unremarkable.  CMP remarkable for elevation of her LFTs, alk phos in the 200s.  She was given Zofran , Protonix , fentanyl  and fluids to help with pain control.  She did arrive in the emergency department tachycardic with a heart rate of 116 and afebrile.  Reports last oral intake was last night with chicken noodle soup which she immediately vomited up on consuming. Ultrasound concerning for some gallbladder sludge, she tells me that her pain has improved but it has not subsided.  Therefore given additional medication such as 0.5 of Dilaudid  as she is opioid  nave.  Vitals are remained stable, she remains afebrile.  I do feel that further workup with CT abdomen and pelvis to further evaluate any intra-abdominal pathology at this time. Patient reassessed after receiving the 0.5 of Dilaudid , she does report resolution in symptoms.  She is tolerated apple juice while in the ER.  She tells me that she is feeling better, I did tell her to schedule an appointment with [facility] surgery for further evaluation.  If her symptoms worsen she will need to return to the emergency department.  Patient hemodynamically stable for discharge at this time.  Will go home on a short course of Zofran .  Dispostion:  After consideration of the diagnostic results and the patients response to treatment, I feel that the patent would benefit from close follow-up with general surgery if worsening symptoms will need to return to the emergency department    Portions of this note were generated with Dragon dictation software. Dictation errors may occur despite best attempts at proofreading.   Final diagnoses:  RUQ pain  Nausea vomiting and diarrhea    ED Discharge Orders          Ordered    ondansetron  (ZOFRAN ) 4 MG tablet  Every 6 hours        09/16/23 1529               Evo Aderman,  PA-C 09/16/23 1532    Dreama Longs, MD 09/16/23 2228
